# Patient Record
Sex: Female | Born: 1945 | Race: White | Hispanic: No | Smoking: Never smoker
Health system: Southern US, Community
[De-identification: ages and names within clinical notes are randomized; demographics above are authoritative.]

## PROBLEM LIST (undated history)

## (undated) DIAGNOSIS — I10 Essential (primary) hypertension: Secondary | ICD-10-CM

## (undated) DIAGNOSIS — I4510 Unspecified right bundle-branch block: Secondary | ICD-10-CM

## (undated) DIAGNOSIS — I350 Nonrheumatic aortic (valve) stenosis: Secondary | ICD-10-CM

## (undated) DIAGNOSIS — K219 Gastro-esophageal reflux disease without esophagitis: Secondary | ICD-10-CM

## (undated) DIAGNOSIS — M810 Age-related osteoporosis without current pathological fracture: Secondary | ICD-10-CM

## (undated) DIAGNOSIS — Z952 Presence of prosthetic heart valve: Secondary | ICD-10-CM

## (undated) DIAGNOSIS — Z9889 Other specified postprocedural states: Secondary | ICD-10-CM

## (undated) HISTORY — PX: TRANSTHORACIC ECHOCARDIOGRAM: SHX275

## (undated) HISTORY — DX: Other specified postprocedural states: Z98.890

## (undated) HISTORY — DX: Age-related osteoporosis without current pathological fracture: M81.0

---
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
## 2017-02-24 ENCOUNTER — Other Ambulatory Visit: Payer: Self-pay | Admitting: Family

## 2017-02-24 DIAGNOSIS — Z1231 Encounter for screening mammogram for malignant neoplasm of breast: Secondary | ICD-10-CM

## 2017-03-25 ENCOUNTER — Ambulatory Visit
Admission: RE | Admit: 2017-03-25 | Discharge: 2017-03-25 | Disposition: A | Discharge status: Home / Self Care | Payer: Medicare Other | Source: Ambulatory Visit | Attending: Family | Admitting: Family

## 2017-03-25 DIAGNOSIS — Z1231 Encounter for screening mammogram for malignant neoplasm of breast: Secondary | ICD-10-CM

## 2018-02-03 ENCOUNTER — Other Ambulatory Visit: Payer: Self-pay | Admitting: Family

## 2018-02-03 DIAGNOSIS — Z1231 Encounter for screening mammogram for malignant neoplasm of breast: Secondary | ICD-10-CM

## 2018-03-31 ENCOUNTER — Ambulatory Visit: Payer: Medicare Other

## 2018-04-08 ENCOUNTER — Ambulatory Visit
Admission: RE | Admit: 2018-04-08 | Discharge: 2018-04-08 | Disposition: A | Discharge status: Home / Self Care | Payer: Medicare Other | Source: Ambulatory Visit | Attending: Family | Admitting: Family

## 2018-04-08 DIAGNOSIS — Z1231 Encounter for screening mammogram for malignant neoplasm of breast: Secondary | ICD-10-CM

## 2019-02-16 HISTORY — PX: TRANSTHORACIC ECHOCARDIOGRAM: SHX275

## 2019-03-18 ENCOUNTER — Other Ambulatory Visit: Payer: Self-pay | Admitting: Family

## 2019-03-18 DIAGNOSIS — Z1231 Encounter for screening mammogram for malignant neoplasm of breast: Secondary | ICD-10-CM

## 2019-04-05 ENCOUNTER — Other Ambulatory Visit: Payer: Self-pay

## 2019-04-06 ENCOUNTER — Other Ambulatory Visit: Payer: Self-pay

## 2019-04-06 ENCOUNTER — Ambulatory Visit (INDEPENDENT_AMBULATORY_CARE_PROVIDER_SITE_OTHER): Payer: Medicare Other | Admitting: Cardiology

## 2019-04-06 ENCOUNTER — Encounter: Payer: Self-pay | Admitting: Cardiology

## 2019-04-06 VITALS — BP 132/60 | HR 79 | Temp 96.8°F | Ht <= 58 in | Wt 110.0 lb

## 2019-04-06 DIAGNOSIS — R079 Chest pain, unspecified: Secondary | ICD-10-CM | POA: Diagnosis not present

## 2019-04-06 DIAGNOSIS — I350 Nonrheumatic aortic (valve) stenosis: Secondary | ICD-10-CM | POA: Insufficient documentation

## 2019-04-06 DIAGNOSIS — R0600 Dyspnea, unspecified: Secondary | ICD-10-CM | POA: Diagnosis not present

## 2019-04-06 DIAGNOSIS — R0609 Other forms of dyspnea: Secondary | ICD-10-CM

## 2019-04-06 HISTORY — DX: Nonrheumatic aortic (valve) stenosis: I35.0

## 2019-04-06 NOTE — H&P (View-Only) (Signed)
PCP: [name], NP  Clinic Note: Chief Complaint  Patient presents with  . New Patient (Initial Visit)    Aortic Stenosis  . Headache  . Chest Pain  . Shortness of Breath  . Edema    Ankles a little.    HPI:    Crystal Rose is a 73 y.o. female who is being seen today for the evaluation of Severe Aortic Stenosis on Echo (from PCP) at the request of [name], NP.  Crystal Rose was l last seen by her PCP on February 04, 2019 for routine follow-up.  She had been followed for many years for an aortic murmur.  Last echo in 2018 that showed moderate calcified aortic stenosis with mild regurgitation and grade 2 diastolic function. --> Surveillance 2D echo ordered, shown to have severe aortic stenosis and now referred TOcardiology.  Recent Hospitalizations: NONE  Reviewed  CV studies:   The following studies were reviewed today: (if available, images/films reviewed: From Epic Chart or Care Everywhere)  2D Echo ([facility]) 01/17/2017: Normal LV size, function.  EF 65-70%.  GRII DD.  Moderate aortic stenosis, mean gradient 27 mmHg, peak gradient 48 mmHg.  Peak velocity 3.5 mmHg.  Mild to moderate aortic Regurgitation.  Mildly elevated PA pressures. . 2D Echo ([facility]) 02/16/2019: Normal LV size and function.  EF 60 to 65%.  Normal RV pressures.  GR 1 DD.  Severe aortic stenosis.  (Peak velocity 4.1 m/sec, mean gradient 40.0 mmHg, peak gradient 69 mmHg).    Interval History:   Crystal Rose is a very pleasant elderly woman who is here today in referral for echocardiogram showing progression of her aortic valve disease to severe aortic stenosis.  She tells me that over the last year or so she has noted that she has had a slow down a bit.  Previously she would walk around doing what ever she would want to do, but now she notices that going up hills or up an incline/stairs will make her short of breath and if she really pushes it she may even feel some tightness in her chest.  Initially she  had chalked this up to just the fact that she may be getting a little bit lazy. She used to enjoy doing yard work and housework as well as walking, but has not done as much over the last year or so.  She does occasionally get some orthostatic dizziness after lying down or sitting down for long period time.  Has not had any syncope or near syncope though.  Cardiovascular review of symptoms: positive for - chest pain, dyspnea on exertion and Decreased exercise tolerance.  Orthostatic dizziness negative for - edema, irregular heartbeat, orthopnea, palpitations, paroxysmal nocturnal dyspnea, rapid heart rate, shortness of breath or Syncope/near syncope, TIA/amaurosis fugax.  Claudication  The patient does not have symptoms concerning for COVID-19 infection (fever, chills, cough, or new shortness of breath).  The patient is practicing social distancing. ++ Masking.  + Groceries/shopping -> she usually lets her daughter do this..  Retired.  Mostly does things around the house, but it stayed pretty well isolated.  Does church on line for instance.   REVIEWED OF SYSTEMS   ROS: A comprehensive was performed. Review of Systems  Constitutional: Negative for diaphoresis, malaise/fatigue (Just that she has a little decreased exercise tolerance and maybe is a little bit "lazy ".) and weight loss.  HENT: Positive for congestion (And rhinorrhea). Negative for nosebleeds and sore throat.  Symptoms of seasonal allergies  Eyes:       No changes in vision, has had cataracts.  Uses contacts and glasses  Respiratory: Negative for cough, shortness of breath and wheezing (Only with allergies).        Only has wheezing and sneezing with seasonal allergies.  Gastrointestinal: Negative for abdominal pain, blood in stool, melena, nausea and vomiting.       Mild indigestion.  H 2 blocker.  Genitourinary: Negative for hematuria.  Musculoskeletal: Positive for joint pain (Mild arthralgias (episodes of knee and hip  pain with lots of walking), but no significant concerns). Negative for falls.  Skin: Negative for itching and rash.  Neurological: Positive for dizziness (Per HPI). Negative for focal weakness and weakness.  Endo/Heme/Allergies: Positive for environmental allergies (Treats with Xyzal or Allegra along with "Nettie pot ").   I have reviewed and (if needed) personally updated the patient's problem list, medications, allergies, past medical and surgical history, social and family history.   PAST MEDICAL HISTORY   Past Medical History:  Diagnosis Date  . Osteoporosis, post-menopausal   . Severe aortic stenosis by prior echocardiography 04/06/2019   2D Echo [facility]) 02/16/2019: Severe aortic stenosis.  (Peak velocity 4.1 m/sec, mean gradient 40.0 mmHg, peak gradient 69 mmHg).     PAST SURGICAL HISTORY   Past Surgical History:  Procedure Laterality Date  . TRANSTHORACIC ECHOCARDIOGRAM  02/16/2019   2D Echo [facility]) : Normal LV size and function.  EF 60 to 65%.  Normal RV pressures.  GR 1 DD.  Severe aortic stenosis.  (Peak velocity 4.1 m/sec, mean gradient 40.0 mmHg, peak gradient 69 mmHg).      MEDICATIONS/ALLERGIES   Current Meds  Medication Sig  . acetaminophen (TYLENOL 8 HOUR) 650 MG CR tablet Take 650 mg by mouth every 8 (eight) hours as needed for pain.  . Ascorbic Acid (VITAMIN C) 1000 MG tablet Take 1,000 mg by mouth daily.  . ASPIRIN 81 PO Take 81 mg by mouth. Frequency:QD   Dosage:81   MG  Instructions:  Note:Dose: 81MG   . B Complex Vitamins (VITAMIN B COMPLEX) TABS Take by mouth. Frequency:QD   Dosage:0.0     Instructions:  Note:Dose: 1 TAB  . BL BETA CAROTENE PO Take by mouth. 1 po qd  . Calcium Carbonate-Vit D-Min (CALCIUM 1200 PO) Take 1,200 mg by mouth daily.  . cholecalciferol (VITAMIN D3) 25 MCG (1000 UT) tablet Take 1,000 Units by mouth daily.  Marland Kitchen. CINNAMON PO Take 1,000 mg by mouth daily.  Marland Kitchen. CRANBERRY FRUIT PO Take by mouth. 4200 mg po qd  . ibuprofen (ADVIL)  200 MG tablet   . levocetirizine (XYZAL) 5 MG tablet Take 5 mg by mouth every evening.  . Magnesium 250 MG TABS Take 250 mg by mouth daily.  . TURMERIC PO Take 1,000 mg by mouth daily.    No Known Allergies   SOCIAL HISTORY/FAMILY HISTORY   Social History   Tobacco Use  . Smoking status: Never Smoker  . Smokeless tobacco: Never Used  Substance Use Topics  . Alcohol use: Not Currently  . Drug use: Never   Social History   Social History Narrative   She is a retired Pharmacologistpharmacy technician.  Has 1 daughter that she lives with in [place].      Does not exercise regularly, but does yard work and gardening.   She lives an active lifestyle and tends to have a relatively healthy diet.      Her  daughter is a grade school teacher, and Crystal Rose has been making masks for her daughter to wear at school.    family history includes Alcoholism in her brother; Breast cancer in her mother; Dementia in her sister; Drug abuse in her brother; Heart attack (age of onset: 62) in her mother.   OBJCTIVE -PE, EKG, labs   Wt Readings from Last 3 Encounters:  04/06/19 110 lb (49.9 kg)    Physical Exam: BP 132/60 (BP Location: Left Arm, Patient Position: Sitting, Cuff Size: Normal)   Pulse 79   Temp (!) 96.8 F (36 C)   Ht  (1.473 m)   Wt 110 lb (49.9 kg)   BMI 22.99 kg/m  Physical Exam  Constitutional: She is oriented to person, place, and time. She appears well-developed and well-nourished. No distress.  Healthy-appearing lady.  No acute distress.  HENT:  Head: Normocephalic and atraumatic.  Eyes: Pupils are equal, round, and reactive to light. Conjunctivae and EOM are normal. No scleral icterus.  Neck: Normal range of motion. Neck supple. Decreased carotid pulses present. No hepatojugular reflux and no JVD present. Carotid bruit is not present (Cannot distinguish, but most likely radiated aortic murmur).  Cardiovascular: Normal rate, regular rhythm and normal pulses.  Occasional  extrasystoles are present. PMI is not displaced. Exam reveals no gallop and no friction rub.  Murmur heard. High-pitched harsh crescendo-decrescendo mid to late systolic murmur is present with a grade of 4/6 at the upper right sternal border radiating to the neck. Pulmonary/Chest: Effort normal and breath sounds normal. No respiratory distress. She has no wheezes. She has no rales.  Abdominal: Soft. Bowel sounds are normal. She exhibits no distension. There is no abdominal tenderness. There is no rebound.  Musculoskeletal: Normal range of motion.        General: No deformity or edema.  Neurological: She is oriented to person, place, and time. No cranial nerve deficit.  Skin: Skin is warm and dry. No erythema.  Psychiatric: She has a normal mood and affect. Her behavior is normal. Judgment and thought content normal.  Vitals reviewed.   Adult ECG Report  Rate: 79 ;  Rhythm: normal sinus rhythm and LVH with repolarization changes, LAFB.;   Narrative Interpretation: Abnormal but relatively stable appearing EKG  Recent Labs: From PCPs office 02/04/2019  Na+ 136, K+ 4.3, Cl- 102, HCO3-26, BUN 24, Cr 0.6, Glu 109, Ca2+ 9.8; AST 36, ALT 28, AlkP 85,  T Bili 0.6, TP 7.0,Alb 4.4.  CBC: W 4.7, H/H 12.4/36.1, Plt 331  TC 209, TG 94, HDL 106, LDL 84   ASSESSMENT/PLAN    Problem List Items Addressed This Visit    Severe aortic stenosis by prior echocardiography - Primary (Chronic)    Current Echo from [facility] suggest progression of aortic valve stenosis to severe from previous moderate in 2018. Pretty significant job and gradient in 2 years.  She is now having more exertional dyspnea and although she did not mention to her PCP, she did mention to me that if she pushes beyond the dyspnea, she will have some chest tightness.  At this point I think it is reasonable to consider that she is having somewhat symptomatic aortic stenosis with progression of disease.  We will proceed with evaluation  for possible aortic valve replacement.  Plan: We will initiate work-up with right and left heart catheterization.  I discussed with her the reasoning for this to evaluate pressures and for potential existing coronary disease.  This would also help  in determining options for aortic valve replacement.  We will need to review with valve team to determine if a local echocardiogram is required or if they can get the imaging from [facility].  I anticipate that she will be set up to follow-up in our structural heart/valve clinic once we have complete her cardiac catheterization.  She will likely be seen by at least 2 members of the valve team and we discussed and their conference to determine appropriate COA.      Relevant Orders   EKG 12-Lead   LEFT AND RIGHT HEART CATHETERIZATION WITH CORONARY ANGIOGRAM   Basic metabolic panel   CBC   DOE (dyspnea on exertion)    This could be related to deconditioning, but it seems like is been going on for the past year and coincides with worsening aortic stenosis.  Need to consider her dyspnea has a likely manifestation of aortic stenosis as there is some component of chest pain as well.  Plan: Right left heart catheterization, and referral to valve clinic      Relevant Orders   EKG 12-Lead   LEFT AND RIGHT HEART CATHETERIZATION WITH CORONARY ANGIOGRAM   Basic metabolic panel   CBC   Chest pain with high risk for cardiac etiology    Chest tightness in the setting of exertional dyspnea is concerning for potentially ischemic chest pain that may or may not be related to CAD versus her valve.  As part of AVR work-up, will start with right and left heart catheterization.  Based on her relatively remarkable lipids, I do not suspect that she will have any significant CAD.       Relevant Orders   EKG 12-Lead   LEFT AND RIGHT HEART CATHETERIZATION WITH CORONARY ANGIOGRAM   Basic metabolic panel   CBC      COVID-19 Education: The signs and symptoms of COVID-19  were discussed with the patient and how to seek care for testing (follow up with PCP or arrange E-visit).   The importance of social distancing was discussed today.  I spent a total of 30 minutes with the patient and chart review. >  50% of the time was spent in direct patient consultation.  Additional time spent with chart review (studies, outside notes, etc): 20 Total Time: 50 min  Current medicines are reviewed at length with the patient today.  (+/- concerns) n/a Does not take any medications.   Patient Instructions / Medication Changes & Studies & Tests Ordered   Patient Instructions  Medication Instructions:  No changes If you need a refill on your cardiac medications before your next appointment, please call your pharmacy.   Lab work: Labs on nov 3 ,2020 - cbc  bmp If you have labs (blood work) drawn today and your tests are completely normal, you will receive your results only by: Marland Kitchen MyChart Message (if you have MyChart) OR . A paper copy in the mail If you have any lab test that is abnormal or we need to change your treatment, we will call you to review the results.  Testing/Procedures: Will be schedule at [address]  [facility] hospital - cath lab Nov 6,2020  Your physician has requested that you have a  Right and left cardiac catheterization. Cardiac catheterization is used to diagnose and/or treat various heart conditions. Doctors may recommend this procedure for a number of different reasons. The most common reason is to evaluate chest pain. Chest pain can be a symptom of coronary artery disease (CAD),  and cardiac catheterization can show whether plaque is narrowing or blocking your heart's arteries. This procedure is also used to evaluate the valves, as well as measure the blood flow and oxygen levels in different parts of your heart. For further information please visit [email or web address] Please follow instruction sheet, as given.    Follow-Up: At [facility], you and your health needs are our priority.  As part of our continuing mission to provide you with exceptional heart care, we have created designated Provider Care Teams.  These Care Teams include your primary Cardiologist (physician) and Advanced Practice Providers (APPs -  Physician Assistants and Nurse Practitioners) who all work together to provide you with the care you need, when you need it. . You will need a follow up appointment in 3 months.  Please call our office 2 months in advance to schedule this appointment.  You may see Bryan Lemma, MD or one of the following Advanced Practice Providers on your designated Care Team:   . Theodore Demark, PA-C . Joni Reining, DNP, ANP  Any Other Special Instructions Will Be Listed Below (If Applicable).   Studies Ordered:   Orders Placed This Encounter  Procedures  . Basic metabolic panel  . CBC  . EKG 12-Lead  . LEFT AND RIGHT HEART CATHETERIZATION WITH CORONARY Jocelyn Lamer, M.D., M.S. Interventional Cardiologist   Pager # [phone] Phone # [phone] [address] [place], [place] [address]   Thank you for choosing Heartcare at [facility]!!

## 2019-04-06 NOTE — Progress Notes (Signed)
PCP: [name], NP  Clinic Note: Chief Complaint  Patient presents with  . New Patient (Initial Visit)    Aortic Stenosis  . Headache  . Chest Pain  . Shortness of Breath  . Edema    Ankles a little.    HPI:    Crystal Rose is a 73 y.o. female who is being seen today for the evaluation of Severe Aortic Stenosis on Echo (from PCP) at the request of [name], NP.  Crystal Rose was l last seen by her PCP on February 04, 2019 for routine follow-up.  She had been followed for many years for an aortic murmur.  Last echo in 2018 that showed moderate calcified aortic stenosis with mild regurgitation and grade 2 diastolic function. --> Surveillance 2D echo ordered, shown to have severe aortic stenosis and now referred TOcardiology.  Recent Hospitalizations: NONE  Reviewed  CV studies:   The following studies were reviewed today: (if available, images/films reviewed: From Epic Chart or Care Everywhere)  2D Echo ([facility]) 01/17/2017: Normal LV size, function.  EF 65-70%.  GRII DD.  Moderate aortic stenosis, mean gradient 27 mmHg, peak gradient 48 mmHg.  Peak velocity 3.5 mmHg.  Mild to moderate aortic Regurgitation.  Mildly elevated PA pressures. . 2D Echo ([facility]) 02/16/2019: Normal LV size and function.  EF 60 to 65%.  Normal RV pressures.  GR 1 DD.  Severe aortic stenosis.  (Peak velocity 4.1 m/sec, mean gradient 40.0 mmHg, peak gradient 69 mmHg).    Interval History:   Crystal Rose is a very pleasant elderly woman who is here today in referral for echocardiogram showing progression of her aortic valve disease to severe aortic stenosis.  She tells me that over the last year or so she has noted that she has had a slow down a bit.  Previously she would walk around doing what ever she would want to do, but now she notices that going up hills or up an incline/stairs will make her short of breath and if she really pushes it she may even feel some tightness in her chest.  Initially she  had chalked this up to just the fact that she may be getting a little bit lazy. She used to enjoy doing yard work and housework as well as walking, but has not done as much over the last year or so.  She does occasionally get some orthostatic dizziness after lying down or sitting down for long period time.  Has not had any syncope or near syncope though.  Cardiovascular review of symptoms: positive for - chest pain, dyspnea on exertion and Decreased exercise tolerance.  Orthostatic dizziness negative for - edema, irregular heartbeat, orthopnea, palpitations, paroxysmal nocturnal dyspnea, rapid heart rate, shortness of breath or Syncope/near syncope, TIA/amaurosis fugax.  Claudication  The patient does not have symptoms concerning for COVID-19 infection (fever, chills, cough, or new shortness of breath).  The patient is practicing social distancing. ++ Masking.  + Groceries/shopping -> she usually lets her daughter do this..  Retired.  Mostly does things around the house, but it stayed pretty well isolated.  Does church on line for instance.   REVIEWED OF SYSTEMS   ROS: A comprehensive was performed. Review of Systems  Constitutional: Negative for diaphoresis, malaise/fatigue (Just that she has a little decreased exercise tolerance and maybe is a little bit "lazy ".) and weight loss.  HENT: Positive for congestion (And rhinorrhea). Negative for nosebleeds and sore throat.  Symptoms of seasonal allergies  Eyes:       No changes in vision, has had cataracts.  Uses contacts and glasses  Respiratory: Negative for cough, shortness of breath and wheezing (Only with allergies).        Only has wheezing and sneezing with seasonal allergies.  Gastrointestinal: Negative for abdominal pain, blood in stool, melena, nausea and vomiting.       Mild indigestion.  H 2 blocker.  Genitourinary: Negative for hematuria.  Musculoskeletal: Positive for joint pain (Mild arthralgias (episodes of knee and hip  pain with lots of walking), but no significant concerns). Negative for falls.  Skin: Negative for itching and rash.  Neurological: Positive for dizziness (Per HPI). Negative for focal weakness and weakness.  Endo/Heme/Allergies: Positive for environmental allergies (Treats with Xyzal or Allegra along with "Nettie pot ").   I have reviewed and (if needed) personally updated the patient's problem list, medications, allergies, past medical and surgical history, social and family history.   PAST MEDICAL HISTORY   Past Medical History:  Diagnosis Date  . Osteoporosis, post-menopausal   . Severe aortic stenosis by prior echocardiography 04/06/2019   2D Echo [facility]) 02/16/2019: Severe aortic stenosis.  (Peak velocity 4.1 m/sec, mean gradient 40.0 mmHg, peak gradient 69 mmHg).     PAST SURGICAL HISTORY   Past Surgical History:  Procedure Laterality Date  . TRANSTHORACIC ECHOCARDIOGRAM  02/16/2019   2D Echo [facility]) : Normal LV size and function.  EF 60 to 65%.  Normal RV pressures.  GR 1 DD.  Severe aortic stenosis.  (Peak velocity 4.1 m/sec, mean gradient 40.0 mmHg, peak gradient 69 mmHg).      MEDICATIONS/ALLERGIES   Current Meds  Medication Sig  . acetaminophen (TYLENOL 8 HOUR) 650 MG CR tablet Take 650 mg by mouth every 8 (eight) hours as needed for pain.  . Ascorbic Acid (VITAMIN C) 1000 MG tablet Take 1,000 mg by mouth daily.  . ASPIRIN 81 PO Take 81 mg by mouth. Frequency:QD   Dosage:81   MG  Instructions:  Note:Dose: 81MG   . B Complex Vitamins (VITAMIN B COMPLEX) TABS Take by mouth. Frequency:QD   Dosage:0.0     Instructions:  Note:Dose: 1 TAB  . BL BETA CAROTENE PO Take by mouth. 1 po qd  . Calcium Carbonate-Vit D-Min (CALCIUM 1200 PO) Take 1,200 mg by mouth daily.  . cholecalciferol (VITAMIN D3) 25 MCG (1000 UT) tablet Take 1,000 Units by mouth daily.  Marland Kitchen. CINNAMON PO Take 1,000 mg by mouth daily.  Marland Kitchen. CRANBERRY FRUIT PO Take by mouth. 4200 mg po qd  . ibuprofen (ADVIL)  200 MG tablet   . levocetirizine (XYZAL) 5 MG tablet Take 5 mg by mouth every evening.  . Magnesium 250 MG TABS Take 250 mg by mouth daily.  . TURMERIC PO Take 1,000 mg by mouth daily.    No Known Allergies   SOCIAL HISTORY/FAMILY HISTORY   Social History   Tobacco Use  . Smoking status: Never Smoker  . Smokeless tobacco: Never Used  Substance Use Topics  . Alcohol use: Not Currently  . Drug use: Never   Social History   Social History Narrative   She is a retired Pharmacologistpharmacy technician.  Has 1 daughter that she lives with in [place].      Does not exercise regularly, but does yard work and gardening.   She lives an active lifestyle and tends to have a relatively healthy diet.      Her  daughter is a grade school teacher, and Jacoby has been making masks for her daughter to wear at school.    family history includes Alcoholism in her brother; Breast cancer in her mother; Dementia in her sister; Drug abuse in her brother; Heart attack (age of onset: 84) in her mother.   OBJCTIVE -PE, EKG, labs   Wt Readings from Last 3 Encounters:  04/06/19 110 lb (49.9 kg)    Physical Exam: BP 132/60 (BP Location: Left Arm, Patient Position: Sitting, Cuff Size: Normal)   Pulse 79   Temp (!) 96.8 F (36 C)   Ht 4' 10" (1.473 m)   Wt 110 lb (49.9 kg)   BMI 22.99 kg/m  Physical Exam  Constitutional: She is oriented to person, place, and time. She appears well-developed and well-nourished. No distress.  Healthy-appearing lady.  No acute distress.  HENT:  Head: Normocephalic and atraumatic.  Eyes: Pupils are equal, round, and reactive to light. Conjunctivae and EOM are normal. No scleral icterus.  Neck: Normal range of motion. Neck supple. Decreased carotid pulses present. No hepatojugular reflux and no JVD present. Carotid bruit is not present (Cannot distinguish, but most likely radiated aortic murmur).  Cardiovascular: Normal rate, regular rhythm and normal pulses.  Occasional  extrasystoles are present. PMI is not displaced. Exam reveals no gallop and no friction rub.  Murmur heard. High-pitched harsh crescendo-decrescendo mid to late systolic murmur is present with a grade of 4/6 at the upper right sternal border radiating to the neck. Pulmonary/Chest: Effort normal and breath sounds normal. No respiratory distress. She has no wheezes. She has no rales.  Abdominal: Soft. Bowel sounds are normal. She exhibits no distension. There is no abdominal tenderness. There is no rebound.  Musculoskeletal: Normal range of motion.        General: No deformity or edema.  Neurological: She is oriented to person, place, and time. No cranial nerve deficit.  Skin: Skin is warm and dry. No erythema.  Psychiatric: She has a normal mood and affect. Her behavior is normal. Judgment and thought content normal.  Vitals reviewed.   Adult ECG Report  Rate: 79 ;  Rhythm: normal sinus rhythm and LVH with repolarization changes, LAFB.;   Narrative Interpretation: Abnormal but relatively stable appearing EKG  Recent Labs: From PCPs office 02/04/2019  Na+ 136, K+ 4.3, Cl- 102, HCO3-26, BUN 24, Cr 0.6, Glu 109, Ca2+ 9.8; AST 36, ALT 28, AlkP 85,  T Bili 0.6, TP 7.0,Alb 4.4.  CBC: W 4.7, H/H 12.4/36.1, Plt 331  TC 209, TG 94, HDL 106, LDL 84   ASSESSMENT/PLAN    Problem List Items Addressed This Visit    Severe aortic stenosis by prior echocardiography - Primary (Chronic)    Current Echo from [facility] suggest progression of aortic valve stenosis to severe from previous moderate in 2018. Pretty significant job and gradient in 2 years.  She is now having more exertional dyspnea and although she did not mention to her PCP, she did mention to me that if she pushes beyond the dyspnea, she will have some chest tightness.  At this point I think it is reasonable to consider that she is having somewhat symptomatic aortic stenosis with progression of disease.  We will proceed with evaluation  for possible aortic valve replacement.  Plan: We will initiate work-up with right and left heart catheterization.  I discussed with her the reasoning for this to evaluate pressures and for potential existing coronary disease.  This would also help   in determining options for aortic valve replacement.  We will need to review with valve team to determine if a local echocardiogram is required or if they can get the imaging from [facility].  I anticipate that she will be set up to follow-up in our structural heart/valve clinic once we have complete her cardiac catheterization.  She will likely be seen by at least 2 members of the valve team and we discussed and their conference to determine appropriate COA.      Relevant Orders   EKG 12-Lead   LEFT AND RIGHT HEART CATHETERIZATION WITH CORONARY ANGIOGRAM   Basic metabolic panel   CBC   DOE (dyspnea on exertion)    This could be related to deconditioning, but it seems like is been going on for the past year and coincides with worsening aortic stenosis.  Need to consider her dyspnea has a likely manifestation of aortic stenosis as there is some component of chest pain as well.  Plan: Right left heart catheterization, and referral to valve clinic      Relevant Orders   EKG 12-Lead   LEFT AND RIGHT HEART CATHETERIZATION WITH CORONARY ANGIOGRAM   Basic metabolic panel   CBC   Chest pain with high risk for cardiac etiology    Chest tightness in the setting of exertional dyspnea is concerning for potentially ischemic chest pain that may or may not be related to CAD versus her valve.  As part of AVR work-up, will start with right and left heart catheterization.  Based on her relatively remarkable lipids, I do not suspect that she will have any significant CAD.       Relevant Orders   EKG 12-Lead   LEFT AND RIGHT HEART CATHETERIZATION WITH CORONARY ANGIOGRAM   Basic metabolic panel   CBC      COVID-19 Education: The signs and symptoms of COVID-19  were discussed with the patient and how to seek care for testing (follow up with PCP or arrange E-visit).   The importance of social distancing was discussed today.  I spent a total of 30 minutes with the patient and chart review. >  50% of the time was spent in direct patient consultation.  Additional time spent with chart review (studies, outside notes, etc): 20 Total Time: 50 min  Current medicines are reviewed at length with the patient today.  (+/- concerns) n/a Does not take any medications.   Patient Instructions / Medication Changes & Studies & Tests Ordered   Patient Instructions  Medication Instructions:  No changes If you need a refill on your cardiac medications before your next appointment, please call your pharmacy.   Lab work: Labs on nov 3 ,2020 - cbc  bmp If you have labs (blood work) drawn today and your tests are completely normal, you will receive your results only by: Marland Kitchen MyChart Message (if you have MyChart) OR . A paper copy in the mail If you have any lab test that is abnormal or we need to change your treatment, we will call you to review the results.  Testing/Procedures: Will be schedule at [address]  [facility] hospital - cath lab Nov 6,2020  Your physician has requested that you have a  Right and left cardiac catheterization. Cardiac catheterization is used to diagnose and/or treat various heart conditions. Doctors may recommend this procedure for a number of different reasons. The most common reason is to evaluate chest pain. Chest pain can be a symptom of coronary artery disease (CAD),  and cardiac catheterization can show whether plaque is narrowing or blocking your heart's arteries. This procedure is also used to evaluate the valves, as well as measure the blood flow and oxygen levels in different parts of your heart. For further information please visit [email or web address] Please follow instruction sheet, as given.    Follow-Up: At [facility], you and your health needs are our priority.  As part of our continuing mission to provide you with exceptional heart care, we have created designated Provider Care Teams.  These Care Teams include your primary Cardiologist (physician) and Advanced Practice Providers (APPs -  Physician Assistants and Nurse Practitioners) who all work together to provide you with the care you need, when you need it. . You will need a follow up appointment in 3 months.  Please call our office 2 months in advance to schedule this appointment.  You may see Bryan Lemma, MD or one of the following Advanced Practice Providers on your designated Care Team:   . Theodore Demark, PA-C . Joni Reining, DNP, ANP  Any Other Special Instructions Will Be Listed Below (If Applicable).   Studies Ordered:   Orders Placed This Encounter  Procedures  . Basic metabolic panel  . CBC  . EKG 12-Lead  . LEFT AND RIGHT HEART CATHETERIZATION WITH CORONARY Jocelyn Lamer, M.D., M.S. Interventional Cardiologist   Pager # [phone] Phone # [phone] [address] [place], [place] [address]   Thank you for choosing Heartcare at [facility]!!

## 2019-04-06 NOTE — Patient Instructions (Addendum)
Medication Instructions:  No changes If you need a refill on your cardiac medications before your next appointment, please call your pharmacy.   Lab work: Labs on nov 3 ,2020 - cbc  bmp If you have labs (blood work) drawn today and your tests are completely normal, you will receive your results only by: Marland Kitchen MyChart Message (if you have MyChart) OR . A paper copy in the mail If you have any lab test that is abnormal or we need to change your treatment, we will call you to review the results.  Testing/Procedures: Will be schedule at [address]  [facility] hospital - cath lab Nov 6,2020  Your physician has requested that you have a  Right and left cardiac catheterization. Cardiac catheterization is used to diagnose and/or treat various heart conditions. Doctors may recommend this procedure for a number of different reasons. The most common reason is to evaluate chest pain. Chest pain can be a symptom of coronary artery disease (CAD), and cardiac catheterization can show whether plaque is narrowing or blocking your heart's arteries. This procedure is also used to evaluate the valves, as well as measure the blood flow and oxygen levels in different parts of your heart. For further information please visit [email or web address] Please follow instruction sheet, as given.    Follow-Up: At [facility], you and your health needs are our priority.  As part of our continuing mission to provide you with exceptional heart care, we have created designated Provider Care Teams.  These Care Teams include your primary Cardiologist (physician) and Advanced Practice Providers (APPs -  Physician Assistants and Nurse Practitioners) who all work together to provide you with the care you need, when you need it. . You will need a follow up appointment in 3 months.  Please call our office 2 months in advance to schedule this appointment.  You may see Bryan Lemma, MD or one of the following Advanced Practice  Providers on your designated Care Team:   . Theodore Demark, PA-C . Joni Reining, DNP, ANP  Any Other Special Instructions Will Be Listed Below (If Applicable).       MEDICAL GROUP [facility] CARDIOVASCULAR DIVISION [facility] [address] [place] [address] Dept: [phone] Loc: [phone]  Crystal Rose  04/06/2019  You are scheduled for a Cardiac Catheterization on Friday, November 6 with Dr. Bryan Lemma.  1. Please arrive at the [facility] (Main Entrance A) at [facility]: [address] [place], [place] [address] at 7:00 AM (This time is two hours before your procedure to ensure your preparation). Free valet parking service is available.   Special note: Every effort is made to have your procedure done on time. Please understand that emergencies sometimes delay scheduled procedures.  2. Diet: Do not eat solid foods after midnight.  The patient may have clear liquids until 5am upon the day of the procedure.  3. Labs:  CBC, BMP You will need to have blood drawn on Tuesday, November 3 at [facility] [address], [place]  Open: 8am - 5pm (Lunch 12:30 - 1:30)   Phone: [phone]. You do not need to be fasting.  PLEASE GO TO [address]  FOR COVID TESTING  2:05 PM AND SELF ISOLATE UNTIL FRIDAY Apr 26, 2019.  4. Medication instructions in preparation for your procedure  On the morning of your procedure, take your Aspirin 81 mg and any morning medicines NOT listed above.  You may use  sips of water.  5. Plan for one night stay--bring personal belongings. 6. Bring a current list of your medications and current insurance cards. 7. You MUST have a responsible person to drive you home. 8. Someone MUST be with you the first 24 hours after you arrive home or your discharge will be delayed. 9. Please wear clothes that are easy to get on and off and wear slip-on shoes.  Thank you for allowing [place] to care for  you!   -- [place]

## 2019-04-07 ENCOUNTER — Encounter: Payer: Self-pay | Admitting: Cardiology

## 2019-04-07 NOTE — Assessment & Plan Note (Signed)
Current Echo from [facility] suggest progression of aortic valve stenosis to severe from previous moderate in 2018. Pretty significant job and gradient in 2 years.  She is now having more exertional dyspnea and although she did not mention to her PCP, she did mention to me that if she pushes beyond the dyspnea, she will have some chest tightness.  At this point I think it is reasonable to consider that she is having somewhat symptomatic aortic stenosis with progression of disease.  We will proceed with evaluation for possible aortic valve replacement.  Plan: We will initiate work-up with right and left heart catheterization.  I discussed with her the reasoning for this to evaluate pressures and for potential existing coronary disease.  This would also help in determining options for aortic valve replacement.  We will need to review with valve team to determine if a local echocardiogram is required or if they can get the imaging from [facility].  I anticipate that she will be set up to follow-up in our structural heart/valve clinic once we have complete her cardiac catheterization.  She will likely be seen by at least 2 members of the valve team and we discussed and their conference to determine appropriate COA.

## 2019-04-07 NOTE — Assessment & Plan Note (Signed)
Chest tightness in the setting of exertional dyspnea is concerning for potentially ischemic chest pain that may or may not be related to CAD versus her valve.  As part of AVR work-up, will start with right and left heart catheterization.  Based on her relatively remarkable lipids, I do not suspect that she will have any significant CAD.

## 2019-04-07 NOTE — Assessment & Plan Note (Addendum)
This could be related to deconditioning, but it seems like is been going on for the past year and coincides with worsening aortic stenosis.  Need to consider her dyspnea has a likely manifestation of aortic stenosis as there is some component of chest pain as well.  Plan: Right left heart catheterization, and referral to valve clinic

## 2019-04-25 ENCOUNTER — Telehealth: Payer: Self-pay | Admitting: Cardiology

## 2019-04-25 ENCOUNTER — Other Ambulatory Visit: Payer: Self-pay | Admitting: *Deleted

## 2019-04-25 DIAGNOSIS — R0600 Dyspnea, unspecified: Secondary | ICD-10-CM

## 2019-04-25 DIAGNOSIS — I350 Nonrheumatic aortic (valve) stenosis: Secondary | ICD-10-CM

## 2019-04-25 DIAGNOSIS — R0609 Other forms of dyspnea: Secondary | ICD-10-CM

## 2019-04-25 NOTE — Telephone Encounter (Signed)
° ° °  Patient needs additional instructions for pre testing (procedure scheduled 11/6)

## 2019-04-25 NOTE — Progress Notes (Signed)
LEFT AND RIGHT HEART CATH  AND ECHO ORDERS PLACED.

## 2019-04-25 NOTE — Telephone Encounter (Signed)
Called patient- she had questions on where to have her pre lab work- advised of our office and no appointment needed, as well as where to go for COVID screening- advised of where to go for that as well. Patient verbalized understanding.

## 2019-04-26 ENCOUNTER — Other Ambulatory Visit (HOSPITAL_COMMUNITY)
Admission: RE | Admit: 2019-04-26 | Discharge: 2019-04-26 | Disposition: A | Discharge status: Home / Self Care | Payer: Medicare Other | Source: Ambulatory Visit | Attending: Cardiology | Admitting: Cardiology

## 2019-04-26 DIAGNOSIS — Z01812 Encounter for preprocedural laboratory examination: Secondary | ICD-10-CM | POA: Insufficient documentation

## 2019-04-26 DIAGNOSIS — Z20828 Contact with and (suspected) exposure to other viral communicable diseases: Secondary | ICD-10-CM | POA: Insufficient documentation

## 2019-04-27 LAB — CBC
Hematocrit: 36.7 % (ref 34.0–46.6)
Hemoglobin: 12.7 g/dL (ref 11.1–15.9)
MCH: 33.4 pg — ABNORMAL HIGH (ref 26.6–33.0)
MCHC: 34.6 g/dL (ref 31.5–35.7)
MCV: 97 fL (ref 79–97)
Platelets: 333 10*3/uL (ref 150–450)
RBC: 3.8 x10E6/uL (ref 3.77–5.28)
RDW: 11.6 % — ABNORMAL LOW (ref 11.7–15.4)
WBC: 5.7 10*3/uL (ref 3.4–10.8)

## 2019-04-27 LAB — BASIC METABOLIC PANEL
BUN/Creatinine Ratio: 27 (ref 12–28)
BUN: 17 mg/dL (ref 8–27)
CO2: 27 mmol/L (ref 20–29)
Calcium: 10.1 mg/dL (ref 8.7–10.3)
Chloride: 100 mmol/L (ref 96–106)
Creatinine, Ser: 0.64 mg/dL (ref 0.57–1.00)
GFR calc Af Amer: 101 mL/min/{1.73_m2} (ref >59)
GFR calc non Af Amer: 88 mL/min/{1.73_m2} (ref >59)
Glucose: 100 mg/dL — ABNORMAL HIGH (ref 65–99)
Potassium: 4.9 mmol/L (ref 3.5–5.2)
Sodium: 139 mmol/L (ref 134–144)

## 2019-04-27 LAB — NOVEL CORONAVIRUS, NAA (HOSP ORDER, SEND-OUT TO REF LAB; TAT 18-24 HRS): SARS-CoV-2, NAA: NOT DETECTED

## 2019-04-28 ENCOUNTER — Telehealth: Payer: Self-pay | Admitting: *Deleted

## 2019-04-28 NOTE — Telephone Encounter (Signed)
Pt contacted pre-catheterization scheduled at [facility] for:  Friday April 29, 2019 9 AM Verified arrival time and place: [facility]) at: 7 AM   No solid food after midnight prior to cath, clear liquids until 5 AM day of procedure. Contrast allergy: no  AM meds can be  taken pre-cath with sip of water including: ASA 81 mg   Confirmed patient has responsible adult to drive home post procedure and observe 24 hours after arriving home: yes  Currently, due to Covid-19 pandemic, only one support person will be allowed with patient. Must be the same support person for that patient's entire stay, will be screened and required to wear a mask. They will be asked to wait in the waiting room for the duration of the patient's stay.  Patients are required to wear a mask when they enter the hospital.      COVID-19 Pre-Screening Questions:  . In the past 7 to 10 days have you had a cough,  shortness of breath, headache, congestion, fever (100 or greater) body aches, chills, sore throat, or sudden loss of taste or sense of smell? no . Have you been around anyone with known Covid 19? no . Have you been around anyone who is awaiting Covid 19 test results in the past 7 to 10 days? no . Have you been around anyone who has been exposed to Covid 19, or has mentioned symptoms of Covid 19 within the past 7 to 10 days? no   I reviewed procedure/mask/visitor instructions, Covid-19 screening questions with patient, she verbalized understanding, thanked me for call.

## 2019-04-29 ENCOUNTER — Ambulatory Visit (HOSPITAL_BASED_OUTPATIENT_CLINIC_OR_DEPARTMENT_OTHER): Payer: Medicare Other

## 2019-04-29 ENCOUNTER — Other Ambulatory Visit: Payer: Self-pay

## 2019-04-29 ENCOUNTER — Other Ambulatory Visit (HOSPITAL_COMMUNITY): Payer: Medicare Other

## 2019-04-29 ENCOUNTER — Ambulatory Visit (HOSPITAL_COMMUNITY)
Admission: RE | Admit: 2019-04-29 | Discharge: 2019-04-29 | Disposition: A | Discharge status: Home / Self Care | Payer: Medicare Other | Attending: Cardiology | Admitting: Cardiology

## 2019-04-29 ENCOUNTER — Encounter (HOSPITAL_COMMUNITY)
Admission: RE | Disposition: A | Discharge status: Home / Self Care | Payer: Self-pay | Source: Home / Self Care | Attending: Cardiology

## 2019-04-29 DIAGNOSIS — I350 Nonrheumatic aortic (valve) stenosis: Secondary | ICD-10-CM | POA: Diagnosis not present

## 2019-04-29 DIAGNOSIS — R0609 Other forms of dyspnea: Secondary | ICD-10-CM | POA: Diagnosis not present

## 2019-04-29 DIAGNOSIS — Z7982 Long term (current) use of aspirin: Secondary | ICD-10-CM | POA: Diagnosis not present

## 2019-04-29 DIAGNOSIS — R079 Chest pain, unspecified: Secondary | ICD-10-CM

## 2019-04-29 DIAGNOSIS — R0600 Dyspnea, unspecified: Secondary | ICD-10-CM

## 2019-04-29 DIAGNOSIS — R0789 Other chest pain: Secondary | ICD-10-CM | POA: Diagnosis not present

## 2019-04-29 DIAGNOSIS — Z79899 Other long term (current) drug therapy: Secondary | ICD-10-CM | POA: Diagnosis not present

## 2019-04-29 HISTORY — PX: RIGHT HEART CATH: CATH118263

## 2019-04-29 HISTORY — PX: CORONARY ANGIOGRAPHY: CATH118303

## 2019-04-29 LAB — POCT I-STAT 7, (LYTES, BLD GAS, ICA,H+H)
Bicarbonate: 24.2 mmol/L (ref 20.0–28.0)
Calcium, Ion: 1.25 mmol/L (ref 1.15–1.40)
HCT: 33 % — ABNORMAL LOW (ref 36.0–46.0)
Hemoglobin: 11.2 g/dL — ABNORMAL LOW (ref 12.0–15.0)
O2 Saturation: 95 %
Potassium: 3.8 mmol/L (ref 3.5–5.1)
Sodium: 136 mmol/L (ref 135–145)
TCO2: 25 mmol/L (ref 22–32)
pCO2 arterial: 36.4 mmHg (ref 32.0–48.0)
pH, Arterial: 7.431 (ref 7.350–7.450)
pO2, Arterial: 75 mmHg — ABNORMAL LOW (ref 83.0–108.0)

## 2019-04-29 LAB — POCT I-STAT EG7
Bicarbonate: 24.5 mmol/L (ref 20.0–28.0)
Bicarbonate: 25.2 mmol/L (ref 20.0–28.0)
Calcium, Ion: 1.17 mmol/L (ref 1.15–1.40)
Calcium, Ion: 1.24 mmol/L (ref 1.15–1.40)
HCT: 32 % — ABNORMAL LOW (ref 36.0–46.0)
HCT: 34 % — ABNORMAL LOW (ref 36.0–46.0)
Hemoglobin: 10.9 g/dL — ABNORMAL LOW (ref 12.0–15.0)
Hemoglobin: 11.6 g/dL — ABNORMAL LOW (ref 12.0–15.0)
O2 Saturation: 77 %
O2 Saturation: 78 %
Potassium: 3.7 mmol/L (ref 3.5–5.1)
Potassium: 3.9 mmol/L (ref 3.5–5.1)
Sodium: 136 mmol/L (ref 135–145)
Sodium: 137 mmol/L (ref 135–145)
TCO2: 26 mmol/L (ref 22–32)
TCO2: 26 mmol/L (ref 22–32)
pCO2, Ven: 38.7 mmHg — ABNORMAL LOW (ref 44.0–60.0)
pCO2, Ven: 40.3 mmHg — ABNORMAL LOW (ref 44.0–60.0)
pH, Ven: 7.405 (ref 7.250–7.430)
pH, Ven: 7.41 (ref 7.250–7.430)
pO2, Ven: 41 mmHg (ref 32.0–45.0)
pO2, Ven: 42 mmHg (ref 32.0–45.0)

## 2019-04-29 LAB — ECHOCARDIOGRAM COMPLETE
Height: 58 in
Weight: 1760 oz

## 2019-04-29 SURGERY — RIGHT HEART CATH

## 2019-04-29 MED ORDER — SODIUM CHLORIDE 0.9% FLUSH: 3.0000 mL | INTRAVENOUS | Status: DC | PRN

## 2019-04-29 MED ORDER — FENTANYL CITRATE (PF) 100 MCG/2ML IJ SOLN
INTRAMUSCULAR | Status: DC | PRN
  Administered 2019-04-29: 25 ug via INTRAVENOUS

## 2019-04-29 MED ORDER — FENTANYL CITRATE (PF) 100 MCG/2ML IJ SOLN
INTRAMUSCULAR | Status: AC
  Filled 2019-04-29: qty 2

## 2019-04-29 MED ORDER — HEPARIN (PORCINE) IN NACL 1000-0.9 UT/500ML-% IV SOLN
INTRAVENOUS | Status: DC | PRN
  Administered 2019-04-29 (×2): 500 mL

## 2019-04-29 MED ORDER — LIDOCAINE HCL (PF) 1 % IJ SOLN
INTRAMUSCULAR | Status: DC | PRN
  Administered 2019-04-29 (×2): 2 mL

## 2019-04-29 MED ORDER — HEPARIN (PORCINE) IN NACL 1000-0.9 UT/500ML-% IV SOLN
INTRAVENOUS | Status: AC
  Filled 2019-04-29: qty 1000

## 2019-04-29 MED ORDER — MIDAZOLAM HCL 2 MG/2ML IJ SOLN
INTRAMUSCULAR | Status: AC
  Filled 2019-04-29: qty 2

## 2019-04-29 MED ORDER — VERAPAMIL HCL 2.5 MG/ML IV SOLN
INTRAVENOUS | Status: AC
  Filled 2019-04-29: qty 2

## 2019-04-29 MED ORDER — SODIUM CHLORIDE 0.9 % IV SOLN
INTRAVENOUS | Status: DC
  Administered 2019-04-29: 07:00:00 via INTRAVENOUS

## 2019-04-29 MED ORDER — HEPARIN SODIUM (PORCINE) 1000 UNIT/ML IJ SOLN
INTRAMUSCULAR | Status: DC | PRN
  Administered 2019-04-29: 3000 [IU] via INTRAVENOUS

## 2019-04-29 MED ORDER — IOHEXOL 350 MG/ML SOLN
INTRAVENOUS | Status: DC | PRN
  Administered 2019-04-29: 60 mL

## 2019-04-29 MED ORDER — LIDOCAINE HCL (PF) 1 % IJ SOLN
INTRAMUSCULAR | Status: AC
  Filled 2019-04-29: qty 30

## 2019-04-29 MED ORDER — MIDAZOLAM HCL 2 MG/2ML IJ SOLN
INTRAMUSCULAR | Status: DC | PRN
  Administered 2019-04-29: 1 mg via INTRAVENOUS

## 2019-04-29 MED ORDER — HEPARIN SODIUM (PORCINE) 1000 UNIT/ML IJ SOLN
INTRAMUSCULAR | Status: AC
  Filled 2019-04-29: qty 1

## 2019-04-29 MED ORDER — VERAPAMIL HCL 2.5 MG/ML IV SOLN
INTRAVENOUS | Status: DC | PRN
  Administered 2019-04-29: 10 mL via INTRA_ARTERIAL

## 2019-04-29 SURGICAL SUPPLY — 13 items
CATH BALLN WEDGE 5F 110CM (CATHETERS) ×3 IMPLANT
CATH OPTITORQUE TIG 4.0 5F (CATHETERS) ×3 IMPLANT
DEVICE RAD COMP TR BAND LRG (VASCULAR PRODUCTS) ×3 IMPLANT
GLIDESHEATH SLEND SS 6F .021 (SHEATH) ×3 IMPLANT
GUIDEWIRE INQWIRE 1.5J.035X260 (WIRE) ×1 IMPLANT
INQWIRE 1.5J .035X260CM (WIRE) ×3
KIT HEART LEFT (KITS) ×3 IMPLANT
PACK CARDIAC CATHETERIZATION (CUSTOM PROCEDURE TRAY) ×3 IMPLANT
SHEATH GLIDE SLENDER 4/5FR (SHEATH) ×3 IMPLANT
SHEATH PROBE COVER 6X72 (BAG) ×3 IMPLANT
TRANSDUCER W/STOPCOCK (MISCELLANEOUS) ×3 IMPLANT
TUBING CIL FLEX 10 FLL-RA (TUBING) ×3 IMPLANT
WIRE HI TORQ VERSACORE-J 145CM (WIRE) ×3 IMPLANT

## 2019-04-29 NOTE — Progress Notes (Signed)
  Echocardiogram 2D Echocardiogram has been performed.  Darlina Sicilian M 04/29/2019, 8:24 AM

## 2019-04-29 NOTE — Interval H&P Note (Signed)
History and Physical Interval Note:  04/29/2019 9:45 AM  Crystal Rose  has presented today for surgery, with the diagnosis of Aortic stenosis.  The various methods of treatment have been discussed with the patient and family. After consideration of risks, benefits and other options for treatment, the patient has consented to  Procedure(s): RIGHT/LEFT HEART CATH AND CORONARY ANGIOGRAPHY (N/A) as a surgical intervention.  The patient's history has been reviewed, patient examined, no change in status, stable for surgery.  I have reviewed the patient's chart and labs.  Questions were answered to the patient's satisfaction.     [name]

## 2019-04-29 NOTE — Discharge Instructions (Signed)
Radial Site Care ° °This sheet gives you information about how to care for yourself after your procedure. Your health care provider may also give you more specific instructions. If you have problems or questions, contact your health care provider. °What can I expect after the procedure? °After the procedure, it is common to have: °· Bruising and tenderness at the catheter insertion area. °Follow these instructions at home: °Medicines °· Take over-the-counter and prescription medicines only as told by your health care provider. °Insertion site care °· Follow instructions from your health care provider about how to take care of your insertion site. Make sure you: °? Wash your hands with soap and water before you change your bandage (dressing). If soap and water are not available, use hand sanitizer. °? Change your dressing as told by your health care provider. °? Leave stitches (sutures), skin glue, or adhesive strips in place. These skin closures may need to stay in place for 2 weeks or longer. If adhesive strip edges start to loosen and curl up, you may trim the loose edges. Do not remove adhesive strips completely unless your health care provider tells you to do that. °· Check your insertion site every day for signs of infection. Check for: °? Redness, swelling, or pain. °? Fluid or blood. °? Pus or a bad smell. °? Warmth. °· Do not take baths, swim, or use a hot tub until your health care provider approves. °· You may shower 24-48 hours after the procedure, or as directed by your health care provider. °? Remove the dressing and gently wash the site with plain soap and water. °? Pat the area dry with a clean towel. °? Do not rub the site. That could cause bleeding. °· Do not apply powder or lotion to the site. °Activity ° °· For 24 hours after the procedure, or as directed by your health care provider: °? Do not flex or bend the affected arm. °? Do not push or pull heavy objects with the affected arm. °? Do not  drive yourself home from the hospital or clinic. You may drive 24 hours after the procedure unless your health care provider tells you not to. °? Do not operate machinery or power tools. °· Do not lift anything that is heavier than 10 lb (4.5 kg), or the limit that you are told, until your health care provider says that it is safe. °· Ask your health care provider when it is okay to: °? Return to work or school. °? Resume usual physical activities or sports. °? Resume sexual activity. °General instructions °· If the catheter site starts to bleed, raise your arm and put firm pressure on the site. If the bleeding does not stop, get help right away. This is a medical emergency. °· If you went home on the same day as your procedure, a responsible adult should be with you for the first 24 hours after you arrive home. °· Keep all follow-up visits as told by your health care provider. This is important. °Contact a health care provider if: °· You have a fever. °· You have redness, swelling, or yellow drainage around your insertion site. °Get help right away if: °· You have unusual pain at the radial site. °· The catheter insertion area swells very fast. °· The insertion area is bleeding, and the bleeding does not stop when you hold steady pressure on the area. °· Your arm or hand becomes pale, cool, tingly, or numb. °These symptoms may represent a serious problem   that is an emergency. Do not wait to see if the symptoms will go away. Get medical help right away. Call your local emergency services (911 in the [place] Do not drive yourself to the hospital. °Summary °· After the procedure, it is common to have bruising and tenderness at the site. °· Follow instructions from your health care provider about how to take care of your radial site wound. Check the wound every day for signs of infection. °· Do not lift anything that is heavier than 10 lb (4.5 kg), or the limit that you are told, until your health care provider says  that it is safe. °This information is not intended to replace advice given to you by your health care provider. Make sure you discuss any questions you have with your health care provider. °Document Released: 07/12/2010 Document Revised: 07/15/2017 Document Reviewed: 07/15/2017 °Elsevier Patient Education © 2020 [employer] ° °

## 2019-04-29 NOTE — Progress Notes (Signed)
Discharge instructions reviewed with patient and family. Verbalized understanding. 

## 2019-05-02 ENCOUNTER — Other Ambulatory Visit: Payer: Self-pay

## 2019-05-02 ENCOUNTER — Encounter (HOSPITAL_COMMUNITY): Payer: Self-pay | Admitting: Cardiology

## 2019-05-02 DIAGNOSIS — I350 Nonrheumatic aortic (valve) stenosis: Secondary | ICD-10-CM

## 2019-05-04 ENCOUNTER — Ambulatory Visit: Payer: Medicare Other

## 2019-05-05 ENCOUNTER — Other Ambulatory Visit: Payer: Self-pay

## 2019-05-05 ENCOUNTER — Encounter: Payer: Self-pay | Admitting: Cardiovascular Disease

## 2019-05-05 ENCOUNTER — Ambulatory Visit (INDEPENDENT_AMBULATORY_CARE_PROVIDER_SITE_OTHER): Payer: Medicare Other | Admitting: Cardiovascular Disease

## 2019-05-05 VITALS — BP 146/58 | HR 70 | Ht <= 58 in | Wt 112.0 lb

## 2019-05-05 DIAGNOSIS — I350 Nonrheumatic aortic (valve) stenosis: Secondary | ICD-10-CM

## 2019-05-05 MED ORDER — METOPROLOL TARTRATE 50 MG PO TABS: 50.0000 mg | ORAL_TABLET | Freq: Once | ORAL | 0 refills | Status: DC

## 2019-05-05 NOTE — Patient Instructions (Signed)
Medication Instructions:  No changes today *If you need a refill on your cardiac medications before your next appointment, please call your pharmacy*  Lab Work: None today If you have labs (blood work) drawn today and your tests are completely normal, you will receive your results only by: Marland Kitchen MyChart Message (if you have MyChart) OR . A paper copy in the mail If you have any lab test that is abnormal or we need to change your treatment, we will call you to review the results.  Testing/Procedures: See letter provided today for instructions.

## 2019-05-05 NOTE — Progress Notes (Signed)
[facility] Consult Note  Chief Complaint  Patient presents with  . New Patient (Initial Visit)    Severe aortic stenosis    History of Present Illness: 73 yo female with history of aortic stenosis who is here today as a new consult, referred by Dr. Herbie Baltimore, for evaluation of severe aortic stenosis. She has been followed for moderate aortic stenosis in primary care for several years. Most recent echo in the [facility] system in August 2020 with normal LV size and function, LVEF=60-65%. Grade 1 diastolic dysfunction. There is evidence of aortic stenosis with mean gradient of 40 mmHg, peak gradient 69 mmHg,  She was seen by Dr. Herbie Baltimore 04/06/19 and described fatigue, dyspnea with exertion, chest pressure with maximal exertion and dizziness. Echo in our system 04/29/19 with LVEF=60-65%. The aortic valve leaflets are thickened and calcified with mean gradient 41 mmHg, peak gradient 70.6 mmHg, dimensionless index 0.23, AVA 0.38 cm2. There is mild AI and mild MR. Cardiac cath 04/29/19 with no evidence of CAD.   She tells me today that she continues to have fatigue and dyspnea with exertion. She is very active around her house and yard but has slowed down lately. She has mild chest pressure with peak exertion. Mild lower extremity edema but no more than noticing a line when she takes off her socks. Weight is stable at home. She goes to the dentist regularly. She lives in her own home. Her daughter lives with her. Retired Architect.   Primary Care Physician: Marcell Anger, NP Primary Cardiologist: Bryan Lemma Referring Cardiologist: Bryan Lemma  Past Medical History:  Diagnosis Date  . H/O foot surgery   . Osteoporosis, post-menopausal   . Severe aortic stenosis by prior echocardiography 04/06/2019   2D Echo [facility]) 02/16/2019: Severe aortic stenosis.  (Peak velocity 4.1 m/sec, mean gradient 40.0 mmHg, peak gradient 69 mmHg).     Past Surgical History:   Procedure Laterality Date  . CORONARY ANGIOGRAPHY N/A 04/29/2019   Procedure: CORONARY ANGIOGRAPHY;  Surgeon: Marykay Lex, MD;  Location: [facility] INVASIVE CV LAB;  Service: Cardiovascular;  Laterality: N/A;  . RIGHT HEART CATH N/A 04/29/2019   Procedure: RIGHT HEART CATH;  Surgeon: Marykay Lex, MD;  Location: [facility] INVASIVE CV LAB;  Service: Cardiovascular;  Laterality: N/A;  . TRANSTHORACIC ECHOCARDIOGRAM  02/16/2019   2D Echo [facility]) : Normal LV size and function.  EF 60 to 65%.  Normal RV pressures.  GR 1 DD.  Severe aortic stenosis.  (Peak velocity 4.1 m/sec, mean gradient 40.0 mmHg, peak gradient 69 mmHg).     Current Outpatient Medications  Medication Sig Dispense Refill  . acetaminophen (TYLENOL 8 HOUR) 650 MG CR tablet Take 1,300 mg by mouth every 8 (eight) hours as needed for pain.     . Ascorbic Acid (VITAMIN C) 1000 MG tablet Take 1,000 mg by mouth daily.    Marland Kitchen aspirin (ASPIRIN 81) 81 MG EC tablet Take 81 mg by mouth daily.     . B Complex Vitamins (VITAMIN B COMPLEX) TABS Take 1 tablet by mouth daily.     . Calcium Carbonate-Vit D-Min (CALCIUM 1200 PO) Take 1 tablet by mouth 2 (two) times daily.     . cholecalciferol (VITAMIN D3) 25 MCG (1000 UT) tablet Take 1,000 Units by mouth daily.    . cimetidine (TAGAMET) 200 MG tablet Take 200 mg by mouth daily.    Marland Kitchen CINNAMON PO Take 1,000 mg by mouth 2 (two) times  daily.     Marland Kitchen. CRANBERRY FRUIT PO Take 1 capsule by mouth daily.     Marland Kitchen. ibuprofen (ADVIL) 200 MG tablet Take 200 mg by mouth daily as needed for moderate pain.     . Lactobacillus-Inulin (CULTURELLE DIGESTIVE HEALTH PO) Take 1 capsule by mouth daily.    Marland Kitchen. levocetirizine (XYZAL) 5 MG tablet Take 5 mg by mouth every evening.     . Magnesium 250 MG TABS Take 250 mg by mouth daily.    . multivitamin-lutein (OCUVITE-LUTEIN) CAPS capsule Take 1 capsule by mouth daily.    . TURMERIC PO Take 1,000 mg by mouth daily.    . metoprolol tartrate (LOPRESSOR) 50 MG tablet Take 1 tablet (50  mg total) by mouth once for 1 dose. 1 tablet 0   No current facility-administered medications for this visit.     No Known Allergies  Social History   Socioeconomic History  . Marital status: Widowed    Spouse name: Not on file  . Number of children: 1  . Years of education: Not on file  . Highest education level: Associate degree: occupational, Scientist, product/process developmenttechnical, or vocational program  Occupational History  . Occupation: Engineer, productionetired-pharmacy tech/hairdresser  Social Needs  . Financial resource strain: Not on file  . Food insecurity    Worry: Not on file    Inability: Not on file  . Transportation needs    Medical: Not on file    Non-medical: Not on file  Tobacco Use  . Smoking status: Never Smoker  . Smokeless tobacco: Never Used  Substance and Sexual Activity  . Alcohol use: Not Currently  . Drug use: Never  . Sexual activity: Not on file  Lifestyle  . Physical activity    Days per week: Not on file    Minutes per session: Not on file  . Stress: Not on file  Relationships  . Social Musicianconnections    Talks on phone: Not on file    Gets together: Not on file    Attends religious service: Not on file    Active member of club or organization: Not on file    Attends meetings of clubs or organizations: Not on file    Relationship status: Not on file  . Intimate partner violence    Fear of current or ex partner: Not on file    Emotionally abused: Not on file    Physically abused: Not on file    Forced sexual activity: Not on file  Other Topics Concern  . Not on file  Social History Narrative   She is a retired Pharmacologistpharmacy technician.  Has 1 daughter that she lives with in [place].      Does not exercise regularly, but does yard work and gardening.   She lives an active lifestyle and tends to have a relatively healthy diet.      Her daughter is a grade school teacher, and Bonita QuinLinda has been making masks for her daughter to wear at school.    Family History  Problem Relation Age  of Onset  . Breast cancer Mother   . Heart attack Mother [id number]  . Dementia Sister        Sadly, otherwise healthy  . Alcoholism Brother        Has lost social contact with family  . Drug abuse Brother     Review of Systems:  As stated in the HPI and otherwise negative.   BP (!) 146/58   Pulse 70  Ht 4\' 10"  (1.473 m)   Wt 112 lb (50.8 kg)   BMI 23.41 kg/m   Physical Examination: General: Well developed, well nourished, NAD  HEENT: OP clear, mucus membranes moist  SKIN: warm, dry. No rashes. Neuro: No focal deficits  Musculoskeletal: Muscle strength 5/5 all ext  Psychiatric: Mood and affect normal  Neck: No JVD, no carotid bruits, no thyromegaly, no lymphadenopathy.  Lungs:Clear bilaterally, no wheezes, rhonci, crackles Cardiovascular: Regular rate and rhythm. Loud, harsh, late peaking systolic murmur.  Abdomen:Soft. Bowel sounds present. Non-tender.  Extremities: No lower extremity edema. Pulses are 2 + in the bilateral DP/PT.  EKG:  EKG is not ordered today. The ekg ordered today demonstrates   Echo 04/29/19:  1. The aortic valve is severely calcified. Aortic valve regurgitation is mild. Severe aortic valve stenosis. Vmax 4.2 m/s, MG 41, AVA 0.35, DI 0.23  2. Left ventricular ejection fraction, by visual estimation, is 60 to 65%. The left ventricle has normal function. There is no left ventricular hypertrophy.  3. Left ventricular diastolic parameters are indeterminate.  4. Global right ventricle has normal systolic function.The right ventricular size is normal. No increase in right ventricular wall thickness.  5. Left atrial size was normal.  6. Right atrial size was normal.  7. Mild mitral annular calcification.  8. The mitral valve is abnormal. Mild mitral valve regurgitation.  9. The tricuspid valve is normal in structure. Tricuspid valve regurgitation is mild. 10. The pulmonic valve was not well visualized. Pulmonic valve regurgitation is not visualized. 11. The  tricuspid regurgitant velocity is 2.47 m/s, and with an assumed right atrial pressure of 3 mmHg, the estimated right ventricular systolic pressure is normal at 27.4 mmHg. 12. The inferior vena cava is normal in size with greater than 50% respiratory variability, suggesting right atrial pressure of 3 mmHg.  FINDINGS  Left Ventricle: Left ventricular ejection fraction, by visual estimation, is 60 to 65%. The left ventricle has normal function. The left ventricular internal cavity size was the left ventricle is normal in size. There is no left ventricular hypertrophy.  Left ventricular diastolic parameters are indeterminate.  Right Ventricle: The right ventricular size is normal. No increase in right ventricular wall thickness. Global RV systolic function is has normal systolic function. The tricuspid regurgitant velocity is 2.47 m/s, and with an assumed right atrial pressure  of 3 mmHg, the estimated right ventricular systolic pressure is normal at 27.4 mmHg.  Left Atrium: Left atrial size was normal in size.  Right Atrium: Right atrial size was normal in size  Pericardium: Trivial pericardial effusion is present.  Mitral Valve: The mitral valve is abnormal. Mild mitral annular calcification. Mild mitral valve regurgitation.  Tricuspid Valve: The tricuspid valve is normal in structure. Tricuspid valve regurgitation is mild.  Aortic Valve: The aortic valve is abnormal. Aortic valve regurgitation is mild. Severe aortic stenosis is present. Severe calcifcation. Aortic valve mean gradient measures 41.0 mmHg. Aortic valve peak gradient measures 70.6 mmHg. Aortic valve area, by  VTI measures 0.38 cm.  Pulmonic Valve: The pulmonic valve was not well visualized. Pulmonic valve regurgitation is not visualized.  Aorta: The aortic root is normal in size and structure.  Venous: The inferior vena cava is normal in size with greater than 50% respiratory variability, suggesting right atrial  pressure of 3 mmHg.  IAS/Shunts: The atrial septum is grossly normal.     LEFT VENTRICLE PLAX 2D LVIDd:         3.30 cm  Diastology LVIDs:  2.10 cm  LV e' lateral:   5.11 cm/s LV PW:         0.90 cm  LV E/e' lateral: 23.1 LV IVS:        0.90 cm  LV e' medial:    5.22 cm/s LVOT diam:     1.45 cm  LV E/e' medial:  22.6 LV SV:         30 ml LV SV Index:   20.68 LVOT Area:     1.65 cm    RIGHT VENTRICLE RV S prime:     16.80 cm/s TAPSE (M-mode): 1.8 cm  LEFT ATRIUM             Index       RIGHT ATRIUM           Index LA diam:        2.90 cm 2.05 cm/m  RA Area:     14.70 cm LA Vol (A2C):   39.7 ml 28.11 ml/m RA Volume:   35.90 ml  25.42 ml/m LA Vol (A4C):   39.4 ml 27.89 ml/m LA Biplane Vol: 40.3 ml 28.53 ml/m  AORTIC VALVE AV Area (Vmax):    0.42 cm AV Area (Vmean):   0.39 cm AV Area (VTI):     0.38 cm AV Vmax:           420.00 cm/s AV Vmean:          302.000 cm/s AV VTI:            0.953 m AV Peak Grad:      70.6 mmHg AV Mean Grad:      41.0 mmHg LVOT Vmax:         106.00 cm/s LVOT Vmean:        72.000 cm/s LVOT VTI:          0.217 m LVOT/AV VTI ratio: 0.23   AORTA Ao Root diam: 2.60 cm  MITRAL VALVE                         TRICUSPID VALVE MV Area (PHT): 2.26 cm              TR Peak grad:   24.4 mmHg MV PHT:        97.15 msec            TR Vmax:        247.00 cm/s MV Decel Time: 335 msec MV E velocity: 118.00 cm/s 103 cm/s  SHUNTS MV A velocity: 141.00 cm/s 70.3 cm/s Systemic VTI:  0.22 m MV E/A ratio:  0.84        1.5       Systemic Diam: 1.45 cm  Cardiac cath 04/29/19: SUMMARY  Angiographically Normal Coronary Arteries - Co-dominant  Norma RHC Pressures & CO/CI  Severe AS by Echo  Recent Labs: 04/26/2019: BUN 17; Creatinine, Ser 0.64; Platelets 333 04/29/2019: Hemoglobin 10.9; Potassium 3.7; Sodium 137    Wt Readings from Last 3 Encounters:  05/05/19 112 lb (50.8 kg)  04/29/19 110 lb (49.9 kg)  04/06/19 110 lb (49.9 kg)      Other studies Reviewed: Additional studies/ records that were reviewed today include: Echo and cath images reviewed by me. Office notes reviewed.  Review of the above records demonstrates: severe AS  Assessment and Plan:   1. Severe Aortic Valve Stenosis: She has severe, stage D aortic valve stenosis. I have personally reviewed the echo images. The aortic  valve is thickened, calcified with limited leaflet mobility. I think she would benefit from AVR. Given advanced age, she is not a good candidate for conventional AVR by surgical approach. I think she may be a good candidate for TAVR.   STS Risk Score: Risk of Mortality: 2.294% Renal Failure: 0.797% Permanent Stroke: 1.610% Prolonged Ventilation: 5.743% DSW Infection: 0.046% Reoperation: 3.959% Morbidity or Mortality: 10.040% Short Length of Stay: 45.320% Long Length of Stay: 3.834%  I have reviewed the natural history of aortic stenosis with the patient and their family members  who are present today. We have discussed the limitations of medical therapy and the poor prognosis associated with symptomatic aortic stenosis. We have reviewed potential treatment options, including palliative medical therapy, conventional surgical aortic valve replacement, and transcatheter aortic valve replacement. We discussed treatment options in the context of the patient's specific comorbid medical conditions.   She would like to proceed with planning for TAVR. Risks and benefits of the valve procedure are reviewed with the patient. We will arrange a cardiac CT, CTA of the chest/abdomen and pelvis, carotid artery dopplers, PT assessment and will then be refer her to see one of the CT surgeons on our TAVR team.      Current medicines are reviewed at length with the patient today.  The patient does not have concerns regarding medicines.  The following changes have been made:  no change  Labs/ tests ordered today include:  No orders of the defined  types were placed in this encounter.    Disposition:   FU with the valve team.    Signed, Lauree Chandler, MD 05/05/2019 10:19 AM    [address], [place], [place]  [address] Phone: [phone]; Fax: [phone]

## 2019-05-11 ENCOUNTER — Institutional Professional Consult (permissible substitution): Payer: Medicare Other | Admitting: Surgery

## 2019-05-11 ENCOUNTER — Other Ambulatory Visit: Payer: Self-pay

## 2019-05-11 ENCOUNTER — Ambulatory Visit (HOSPITAL_COMMUNITY): Payer: Medicare Other

## 2019-05-11 ENCOUNTER — Encounter (HOSPITAL_COMMUNITY): Payer: Self-pay

## 2019-05-11 ENCOUNTER — Encounter: Payer: Self-pay | Admitting: Physical Therapy

## 2019-05-11 ENCOUNTER — Other Ambulatory Visit (HOSPITAL_COMMUNITY): Payer: Medicare Other

## 2019-05-11 ENCOUNTER — Ambulatory Visit: Payer: Medicare Other | Attending: Cardiovascular Disease | Admitting: Physical Therapy

## 2019-05-11 ENCOUNTER — Ambulatory Visit (HOSPITAL_COMMUNITY)
Admission: RE | Admit: 2019-05-11 | Discharge: 2019-05-11 | Disposition: A | Discharge status: Home / Self Care | Payer: Medicare Other | Source: Ambulatory Visit | Attending: Cardiovascular Disease | Admitting: Cardiovascular Disease

## 2019-05-11 ENCOUNTER — Ambulatory Visit (HOSPITAL_BASED_OUTPATIENT_CLINIC_OR_DEPARTMENT_OTHER)
Admission: RE | Admit: 2019-05-11 | Discharge: 2019-05-11 | Disposition: A | Discharge status: Home / Self Care | Payer: Medicare Other | Source: Ambulatory Visit | Attending: Cardiovascular Disease | Admitting: Cardiovascular Disease

## 2019-05-11 ENCOUNTER — Encounter: Payer: Medicare Other | Admitting: Surgery

## 2019-05-11 ENCOUNTER — Encounter: Payer: Self-pay | Admitting: Surgery

## 2019-05-11 VITALS — BP 150/70 | HR 76 | Temp 97.6°F | Resp 20 | Ht <= 58 in | Wt 112.0 lb

## 2019-05-11 DIAGNOSIS — I350 Nonrheumatic aortic (valve) stenosis: Secondary | ICD-10-CM | POA: Insufficient documentation

## 2019-05-11 DIAGNOSIS — R293 Abnormal posture: Secondary | ICD-10-CM | POA: Insufficient documentation

## 2019-05-11 MED ORDER — IOHEXOL 350 MG/ML SOLN
95.0000 mL | Freq: Once | INTRAVENOUS | Status: AC | PRN
  Administered 2019-05-11: 95 mL via INTRAVENOUS

## 2019-05-11 NOTE — Therapy (Signed)
[place], [place], [address] Phone: [phone]   Fax:  [phone]  Physical Therapy Evaluation  Patient Details  Name: Jalani Cullifer MRN: [id number] Date of Birth: 05/13/1946 Referring Provider (PT): Lauree Chandler MD   Encounter Date: 05/11/2019  PT End of Session - 05/11/19 1533    Visit Number  1    Number of Visits  1    Date for PT Re-Evaluation  05/11/19    PT Start Time  1501    PT Stop Time  1531    PT Time Calculation (min)  30 min    Activity Tolerance  Patient tolerated treatment well    Behavior During Therapy  [facility] for tasks assessed/performed       Past Medical History:  Diagnosis Date  . H/O foot surgery   . Osteoporosis, post-menopausal   . Severe aortic stenosis by prior echocardiography 04/06/2019   2D Echo ([place]) 02/16/2019: Severe aortic stenosis.  (Peak velocity 4.1 m/sec, mean gradient 40.0 mmHg, peak gradient 69 mmHg).     Past Surgical History:  Procedure Laterality Date  . CORONARY ANGIOGRAPHY N/A 04/29/2019   Procedure: CORONARY ANGIOGRAPHY;  Surgeon: [name], MD;  Location: [facility];  Service: Cardiovascular;  Laterality: N/A;  . RIGHT HEART CATH N/A 04/29/2019   Procedure: RIGHT HEART CATH;  Surgeon: [name], MD;  Location: [facility];  Service: Cardiovascular;  Laterality: N/A;  . TRANSTHORACIC ECHOCARDIOGRAM  02/16/2019   2D Echo [facility]) : Normal LV size and function.  EF 60 to 65%.  Normal RV pressures.  GR 1 DD.  Severe aortic stenosis.  (Peak velocity 4.1 m/sec, mean gradient 40.0 mmHg, peak gradient 69 mmHg).     There were no vitals filed for this visit.   Subjective Assessment - 05/11/19 1507    Subjective  pt is a 73 y.o F with CC of fatigue and dyspnea that has been going for the last 6 months. she does report intermittent chest pain/ tightness and associated dizziness/ lightheaded. She currently lives with  her daughter.    Patient Stated Goals  to get the heart better    Currently in Pain?  No/denies         [facility] PT Assessment - 05/11/19 1509      Assessment   Medical Diagnosis  Severe Aortic Stenosis    Referring Provider (PT)  Lauree Chandler MD    Hand Dominance  Right      Precautions   Precautions  None      Restrictions   Weight Bearing Restrictions  No      Balance Screen   Has the patient fallen in the past 6 months  No      [place] residence    Living Arrangements  Children    Available Help at Discharge  Family    Type of [place] to enter    Entrance Stairs-Number of Steps  4    Entrance Stairs-Rails  Can reach both    [place]  One level    [place] - single point      Prior Function   Level of Independence  Independent      ROM / Strength   AROM / PROM / Strength  AROM;Strength      AROM   Overall AROM   Within  functional limits for tasks performed      Strength   Overall Strength  Within functional limits for tasks performed    Strength Assessment Site  Hand    Right Hand Grip (lbs)  25    Left Hand Grip (lbs)  18      Ambulation/Gait   Ambulation/Gait  Yes    Gait Pattern  Within Functional Limits       [facility] Pre-Surgical Assessment - 05/11/19 0001    5 Meter Walk Test- trial 1  3 sec    5 Meter Walk Test- trial 2  3 sec.     5 Meter Walk Test- trial 3  2 sec.    5 meter walk test average  2.67 sec    4 Stage Balance Test tolerated for:   10 sec.    4 Stage Balance Test Position  4    Sit To Stand Test- trial 1  9 sec.    ADL/IADL Independent with:  Bathing;Dressing;Meal prep;Finances;Yard work    ADL/IADL Freight forwarderraility Index  Vulnerable    6 Minute Walk- Baseline  yes    BP (mmHg)  138/70    HR (bpm)  68    02 Sat (%RA)  99 %    Modified Borg Scale for Dyspnea  1- Very mild shortness of breath    Perceived Rate of Exertion (Borg)  9- very light    6  Minute Walk Post Test  yes    BP (mmHg)  181/69    HR (bpm)  82    02 Sat (%RA)  81 %    Modified Borg Scale for Dyspnea  5- Strong or hard breathing    Perceived Rate of Exertion (Borg)  13- Somewhat hard    Aerobic Endurance Distance Walked  1630    Endurance additional comments  pt is (-) 5.50% limited compared to age related norm    norm for 70-79 is 1545 ft             Objective measurements completed on examination: See above findings.                           Plan - 05/11/19 1533    Clinical Impression Statement  see assessment in note    Stability/Clinical Decision Making  Stable/Uncomplicated    Clinical Decision Making  Low    PT Frequency  One time visit    PT Next Visit Plan  Pre-TAVR evaluation    Consulted and Agree with Plan of Care  Patient      Clinical Impression Statement: Pt is a 73 yo F presenting to OP PT for evaluation prior to possible TAVR surgery due to severe aortic stenosis. Pt reports onset of fatigue, SOB and occasional chest pain/ tightness and dizziness approximately 6 months ago. Symptoms are limiting endurnace. Pt presents with good ROM and strength, good balance and is assessed as low at high fall risk 4 stage balance test, good walking speed and good aerobic endurance per 6 minute walk test. Pt ambulated 1630 feet in without requiring rest break. At end of test, patient's HR was 82 bpm and O2 was 81% on room air. Pt reported 5/10 shortness of breath on modified scale for dyspnea. Pt ambulated a total of 1630 feet in 6 minute walk. SOB, fatigue and O2 desaturation increased significantly with 6 minute walk test. Based on the Short Physical Performance Battery, patient has a frailty rating of  12/12 with </= 5/12 considered frail.    Patient demonstrated the following deficits and impairments:     Visit Diagnosis: Abnormal posture     Problem List Patient Active Problem List   Diagnosis Date Noted  . Severe aortic  stenosis by prior echocardiography 04/06/2019  . DOE (dyspnea on exertion) 04/06/2019  . Chest pain with high risk for cardiac etiology 04/06/2019   Lulu Riding PT, DPT, LAT, ATC  05/11/19  3:37 PM      [facility] [address] [place], [place], [address] Phone: [phone]   Fax:  [phone]  Name: Auset Fritzler MRN: [id number] Date of Birth: November 25, 1945

## 2019-05-11 NOTE — Progress Notes (Signed)
Patient ID: Crystal Rose, female   DOB: 03-17-1946, 73 y.o.   MRN: [id number]  [facility] CONSULTATION REPORT  Referring Provider is Crystal Man, MD Primary Cardiologist is Crystal Hew, MD PCP is Crystal Pac, NP  Chief Complaint  Patient presents with   Aortic Stenosis    Surgical eval for TAVR, review all testing    HPI:  The patient is a 73 year old woman with a history of moderate aortic stenosis that has been followed in the [facility] health system.  Her most recent echocardiogram there in August 2020 showed a mean gradient of 40 mmHg with a peak gradient of 69 mmHg.  Left ventricular ejection fraction was 60 to 65%.  She was seen by Dr. Ellyn Hack in October and reported exertional fatigue and shortness of breath as well as some chest pressure and dizziness.  She had an echocardiogram on 04/29/2019 which showed a calcified aortic valve with restricted leaflet mobility.  The mean gradient was 41 mmHg with a peak gradient of 70.6 mmHg.  There was mild aortic insufficiency.  Left ventricular ejection fraction was normal.  She underwent cardiac catheterization on the same day which showed no coronary disease.  There were normal right heart pressures.  She continues to report exertional fatigue and shortness of breath and being tired most of the time.  She has had occasional episodes of dizziness but has not had any syncope.  She does report intermittent chest pressure with exertion.  She has noted a little swelling in her ankles with an indentation from her socks.  She is a retired Education administrator.  She lives with her daughter in [place] who is a Pharmacist, hospital.    Past Medical History:  Diagnosis Date   H/O foot surgery    Osteoporosis, post-menopausal    Severe aortic stenosis by prior echocardiography 04/06/2019   2D Echo ([facility]) 02/16/2019: Severe aortic stenosis.  (Peak velocity 4.1 m/sec, mean  gradient 40.0 mmHg, peak gradient 69 mmHg).     Past Surgical History:  Procedure Laterality Date   CORONARY ANGIOGRAPHY N/A 04/29/2019   Procedure: CORONARY ANGIOGRAPHY;  Surgeon: Crystal Man, MD;  Location: [facility];  Service: Cardiovascular;  Laterality: N/A;   RIGHT HEART CATH N/A 04/29/2019   Procedure: RIGHT HEART CATH;  Surgeon: Crystal Man, MD;  Location: [facility];  Service: Cardiovascular;  Laterality: N/A;   TRANSTHORACIC ECHOCARDIOGRAM  02/16/2019   2D Echo [facility]) : Normal LV size and function.  EF 60 to 65%.  Normal RV pressures.  GR 1 DD.  Severe aortic stenosis.  (Peak velocity 4.1 m/sec, mean gradient 40.0 mmHg, peak gradient 69 mmHg).     Family History  Problem Relation Age of Onset   Breast cancer Mother    Heart attack Mother 72   Dementia Sister        Sadly, otherwise healthy   Alcoholism Brother        Has lost social contact with family   Drug abuse Brother     Social History   Socioeconomic History   Marital status: Widowed    Spouse name: Not on file   Number of children: 1   Years of education: Not on file   Highest education level: Associate degree: occupational, Hotel manager, or vocational program  Occupational History   Occupation: Retired-pharmacy Medical laboratory scientific officer strain: Not on [employer]  insecurity    Worry: Not on file    Inability: Not on file   Transportation needs    Medical: Not on file    Non-medical: Not on file  Tobacco Use   Smoking status: Never Smoker   Smokeless tobacco: Never Used  Substance and Sexual Activity   Alcohol use: Not Currently   Drug use: Never   Sexual activity: Not on file  Lifestyle   Physical activity    Days per week: Not on file    Minutes per session: Not on file   Stress: Not on file  Relationships   Social connections    Talks on phone: Not on file    Gets together: Not on file    Attends religious  service: Not on file    Active member of club or organization: Not on file    Attends meetings of clubs or organizations: Not on file    Relationship status: Not on file   Intimate partner violence    Fear of current or ex partner: Not on file    Emotionally abused: Not on file    Physically abused: Not on file    Forced sexual activity: Not on file  Other Topics Concern   Not on file  Social History Narrative   She is a retired Education administrator.  Has 1 daughter that she lives with in [place].      Does not exercise regularly, but does yard work and gardening.   She lives an active lifestyle and tends to have a relatively healthy diet.      Her daughter is a grade school teacher, and Crystal Rose has been making masks for her daughter to wear at school.    Current Outpatient Medications  Medication Sig Dispense Refill   acetaminophen (TYLENOL 8 HOUR) 650 MG CR tablet Take 1,300 mg by mouth every 8 (eight) hours as needed for pain.      Ascorbic Acid (VITAMIN C) 1000 MG tablet Take 1,000 mg by mouth daily.     aspirin (ASPIRIN 81) 81 MG EC tablet Take 81 mg by mouth daily.      B Complex Vitamins (VITAMIN B COMPLEX) TABS Take 1 tablet by mouth daily.      Calcium Carbonate-Vit D-Min (CALCIUM 1200 PO) Take 1 tablet by mouth 2 (two) times daily.      cholecalciferol (VITAMIN D3) 25 MCG (1000 UT) tablet Take 1,000 Units by mouth daily.     cimetidine (TAGAMET) 200 MG tablet Take 200 mg by mouth daily.     CINNAMON PO Take 1,000 mg by mouth 2 (two) times daily.      CRANBERRY FRUIT PO Take 1 capsule by mouth daily.      ibuprofen (ADVIL) 200 MG tablet Take 200 mg by mouth daily as needed for moderate pain.      Lactobacillus-Inulin (CULTURELLE DIGESTIVE HEALTH PO) Take 1 capsule by mouth daily.     levocetirizine (XYZAL) 5 MG tablet Take 5 mg by mouth every evening.      Magnesium 250 MG TABS Take 250 mg by mouth daily.     multivitamin-lutein (OCUVITE-LUTEIN) CAPS  capsule Take 1 capsule by mouth daily.     TURMERIC PO Take 1,000 mg by mouth daily.     metoprolol tartrate (LOPRESSOR) 50 MG tablet Take 1 tablet (50 mg total) by mouth once for 1 dose. 1 tablet 0   No current facility-administered medications for this visit.     No Known  Allergies    Review of Systems:   General:  normal appetite, + decreased energy, no weight gain, no weight loss, no fever  Cardiac:  + chest pain with exertion, no chest pain at rest, +SOB with moderate exertion, no resting SOB, no PND, no orthopnea, no palpitations, no arrhythmia, no atrial fibrillation, + LE edema, + dizzy spells, no syncope  Respiratory:  + exertional shortness of breath, no home oxygen, no productive cough, no dry cough, no bronchitis, no wheezing, no hemoptysis, no asthma, no pain with inspiration or cough, no sleep apnea, no CPAP at night  GI:   no difficulty swallowing, + reflux, no frequent heartburn, no hiatal hernia, no abdominal pain, no constipation, no diarrhea, no hematochezia, no hematemesis, no melena  GU:   no dysuria,  no frequency, no urinary tract infection, no hematuria, no kidney stones, no kidney disease  Vascular:  no pain suggestive of claudication, no pain in feet, no leg cramps, no varicose veins, no DVT, no non-healing foot ulcer  Neuro:   no stroke, no TIA's, no seizures, no headaches, no temporary blindness one eye,  no slurred speech, no peripheral neuropathy, no chronic pain, no instability of gait, no memory/cognitive dysfunction  Musculoskeletal: + arthritis, no joint swelling, no myalgias, no difficulty walking, normal mobility   Skin:   no rash, no itching, no skin infections, no pressure sores or ulcerations  Psych:   no anxiety, no depression, no nervousness, no unusual recent stress  Eyes:   no blurry vision, no floaters, no recent vision changes, + wears contacts  ENT:   no hearing loss, no loose or painful teeth, no dentures, last saw dentist  01/2019  Hematologic:  no easy bruising, no abnormal bleeding, no clotting disorder, no frequent epistaxis  Endocrine:  no diabetes, does not check CBG's at home      Physical Exam:   BP (!) 150/70    Pulse 76    Temp 97.6 F (36.4 C) (Skin)    Resp 20    Ht _0  (1.473 m)    Wt 112 lb (50.8 kg)    SpO2 97% Comment: RA   BMI 23.41 kg/m   General:  Thin, well-appearing  HEENT:  Unremarkable, NCAT, PERLA, EOMI  Neck:   no JVD, no bruits, no adenopathy   Chest:   clear to auscultation, symmetrical breath sounds, no wheezes, no rhonchi   CV:   RRR, grade lll/VI crescendo/decrescendo murmur heard best at RSB,  no diastolic murmur  Abdomen:  soft, non-tender, no masses   Extremities:  warm, well-perfused, pulses palpable , no LE edema  Rectal/GU  Deferred  Neuro:   Grossly non-focal and symmetrical throughout  Skin:   Clean and dry, no rashes, no breakdown   Diagnostic Tests:  ECHOCARDIOGRAM REPORT       Patient Name:   CHENOA LUDDY Date of Exam: 04/29/2019 Medical Rec #:  [id number]   Height:       58.0 in Accession #:    [id number]  Weight:       110.0 lb Date of Birth:  1945/12/21   BSA:          1.41 m Patient Age:    58 years    BP:           147/64 mmHg Patient Gender: F           HR:           85 bpm. Exam Location:  Inpatient  Procedure: 2D Echo  Indications:    Aortic valve stenosis 424.1 / 135.0   History:        Patient has prior history of Echocardiogram examinations, most                 recent 02/14/2019. Dyspnea on exertion.   Sonographer:    Darlina Sicilian RDCS Referring Phys: [place]    1. The aortic valve is severely calcified. Aortic valve regurgitation is mild. Severe aortic valve stenosis. Vmax 4.2 m/s, MG 41, AVA 0.35, DI 0.23  2. Left ventricular ejection fraction, by visual estimation, is 60 to 65%. The left ventricle has normal function. There is no left ventricular hypertrophy.  3. Left ventricular diastolic  parameters are indeterminate.  4. Global right ventricle has normal systolic function.The right ventricular size is normal. No increase in right ventricular wall thickness.  5. Left atrial size was normal.  6. Right atrial size was normal.  7. Mild mitral annular calcification.  8. The mitral valve is abnormal. Mild mitral valve regurgitation.  9. The tricuspid valve is normal in structure. Tricuspid valve regurgitation is mild. 10. The pulmonic valve was not well visualized. Pulmonic valve regurgitation is not visualized. 11. The tricuspid regurgitant velocity is 2.47 m/s, and with an assumed right atrial pressure of 3 mmHg, the estimated right ventricular systolic pressure is normal at 27.4 mmHg. 12. The inferior vena cava is normal in size with greater than 50% respiratory variability, suggesting right atrial pressure of 3 mmHg.  FINDINGS  Left Ventricle: Left ventricular ejection fraction, by visual estimation, is 60 to 65%. The left ventricle has normal function. The left ventricular internal cavity size was the left ventricle is normal in size. There is no left ventricular hypertrophy.  Left ventricular diastolic parameters are indeterminate.  Right Ventricle: The right ventricular size is normal. No increase in right ventricular wall thickness. Global RV systolic function is has normal systolic function. The tricuspid regurgitant velocity is 2.47 m/s, and with an assumed right atrial pressure  of 3 mmHg, the estimated right ventricular systolic pressure is normal at 27.4 mmHg.  Left Atrium: Left atrial size was normal in size.  Right Atrium: Right atrial size was normal in size  Pericardium: Trivial pericardial effusion is present.  Mitral Valve: The mitral valve is abnormal. Mild mitral annular calcification. Mild mitral valve regurgitation.  Tricuspid Valve: The tricuspid valve is normal in structure. Tricuspid valve regurgitation is mild.  Aortic Valve: The aortic valve  is abnormal. Aortic valve regurgitation is mild. Severe aortic stenosis is present. Severe calcifcation. Aortic valve mean gradient measures 41.0 mmHg. Aortic valve peak gradient measures 70.6 mmHg. Aortic valve area, by  VTI measures 0.38 cm.  Pulmonic Valve: The pulmonic valve was not well visualized. Pulmonic valve regurgitation is not visualized.  Aorta: The aortic root is normal in size and structure.  Venous: The inferior vena cava is normal in size with greater than 50% respiratory variability, suggesting right atrial pressure of 3 mmHg.  IAS/Shunts: The atrial septum is grossly normal.     LEFT VENTRICLE PLAX 2D LVIDd:         3.30 cm  Diastology LVIDs:         2.10 cm  LV e' lateral:   5.11 cm/s LV PW:         0.90 cm  LV E/e' lateral: 23.1 LV IVS:        0.90 cm  LV e' medial:  5.22 cm/s LVOT diam:     1.45 cm  LV E/e' medial:  22.6 LV SV:         30 ml LV SV Index:   20.68 LVOT Area:     1.65 cm    RIGHT VENTRICLE RV S prime:     16.80 cm/s TAPSE (M-mode): 1.8 cm  LEFT ATRIUM             Index       RIGHT ATRIUM           Index LA diam:        2.90 cm 2.05 cm/m  RA Area:     14.70 cm LA Vol (A2C):   39.7 ml 28.11 ml/m RA Volume:   35.90 ml  25.42 ml/m LA Vol (A4C):   39.4 ml 27.89 ml/m LA Biplane Vol: 40.3 ml 28.53 ml/m  AORTIC VALVE AV Area (Vmax):    0.42 cm AV Area (Vmean):   0.39 cm AV Area (VTI):     0.38 cm AV Vmax:           420.00 cm/s AV Vmean:          302.000 cm/s AV VTI:            0.953 m AV Peak Grad:      70.6 mmHg AV Mean Grad:      41.0 mmHg LVOT Vmax:         106.00 cm/s LVOT Vmean:        72.000 cm/s LVOT VTI:          0.217 m LVOT/AV VTI ratio: 0.23   AORTA Ao Root diam: 2.60 cm  MITRAL VALVE                         TRICUSPID VALVE MV Area (PHT): 2.26 cm              TR Peak grad:   24.4 mmHg MV PHT:        97.15 msec            TR Vmax:        247.00 cm/s MV Decel Time: 335 msec MV E velocity: 118.00 cm/s  103 cm/s  SHUNTS MV A velocity: 141.00 cm/s 70.3 cm/s Systemic VTI:  0.22 m MV E/A ratio:  0.84        1.5       Systemic Diam: 1.45 cm    Oswaldo Milian MD Electronically signed by Oswaldo Milian MD Signature Date/Time: 04/29/2019/8:59:06 AM    Physicians  Panel Physicians Referring Physician Case Authorizing Physician  Crystal Man, MD (Primary)    Procedures  CORONARY ANGIOGRAPHY  RIGHT HEART CATH  Conclusion    Normal RHC Pressures  Normal Coronary Arteries   SUMMARY  Angiographically Normal Coronary Arteries - Co-dominant  Norma RHC Pressures & CO/CI  Severe AS by Echo  RECOMMENDATIONS  D/c home today after bedrest  TAVR / Structural Team to arrange Valve Clinic Appt.  Echocardiogram done this AM   Crystal Hew, MD    Recommendations  Antiplatelet/Anticoag No indication for antiplatelet therapy at this time . Will be on ASA/Plavix post TAVR  Discharge Date In the absence of any other complications or medical issues, we expect the patient to be ready for discharge from a cath perspective on 04/29/2019.  Indications  Severe aortic stenosis by prior echocardiogram [I35.0 (ICD-10-CM)]  Chest pain with high risk for cardiac etiology [  R07.9 (ICD-10-CM)]  DOE (dyspnea on exertion) [R06.00 (ICD-10-CM)]  Procedural Details  Technical Details Primary Care Provider: [name], NP Cardiologist: Crystal Hew, MD  Crystal Rose is a 73 y/o woman referred for New Echocardiogram finding of Severe Aortic Stenosis. She has noted intermittent exertional dyspnea & chest pain with worsening exercise intolerance.  With Severe AS & symptoms concerning for angina, she now presents for L&RHC as part of pre- AVR/TAVR work-up.  Time Out: Verified patient identification, verified procedure, site/side was marked, verified correct patient position, special equipment/implants available, medications/allergies/relevent history reviewed, required imaging and  test results available. Performed.  Access:  * RIGHT Radial Artery: 6 Fr sheath -- Seldinger technique using Micropuncture Kit Direct ultrasound guidance used.  Permanent image obtained and placed on chart. 10 mL radial cocktail IA; 3000 Units IV Heparin  * Right Brachial/Antecubital Vein: The existing 18-gauge IV was exchanged over a wire for a 5Fr  sheath  Right Heart Catheterization: 5 Fr Gordy Councilman catheter advanced under fluoroscopy with balloon inflated to the RA, RV, then PCWP-PA for hemodynamic measurement.  * Simultaneous FA & PA blood gases checked for SaO2% to calculate FICK CO/CI  * Catheter removed completely out of the body with balloon deflated.  Left Heart Catheterization: 5Fr TIG 4.0 Catheter was advanced or exchanged over a J-wire under direct fluoroscopic guidance into the ascending aorta. * LV Hemodynamics (LV Gram): Aortic Valve not crossed * Left & Right Coronary Artery Cineangiography: TIG 4.0 Catheter   Upon completion of Angiogaphy, the catheter was removed completely out of the body over a wire, without complication.  Brachial Sheath(s) removed in the Cath Lab with manual pressure for hemostasis.    Radial sheath removed in the Cardiac Catheterization lab with TR Band placed for hemostasis.  TR Band: 1025  Hours; 14 mL air  MEDICATIONS * SQ Lidocaine 39m * Radial Cocktail: 3 mg Verapmil in 10 mL NS * Isovue Contrast: 60 * Heparin: 3000 Units  Estimated blood loss <50 mL.   During this procedure medications were administered to achieve and maintain moderate conscious sedation while the patient's heart rate, blood pressure, and oxygen saturation were continuously monitored and I was present face-to-face 100% of this time.  Medications (Filter: Administrations occurring from 04/29/19 0920 to 04/29/19 1031) (important)  Continuous medications are totaled by the amount administered until 04/29/19 1031.  Medication Rate/Dose/Volume Action  Date Time   fentaNYL  (SUBLIMAZE) injection (mcg) 25 mcg Given 04/29/19 0948   Total dose as of 04/29/19 1031        25 mcg        midazolam (VERSED) injection (mg) 1 mg Given 04/29/19 0949   Total dose as of 04/29/19 1031        1 mg        lidocaine (PF) (XYLOCAINE) 1 % injection (mL) 2 mL Given 04/29/19 0958   Total dose as of 04/29/19 1031 2 mL Given 0959   4 mL        Heparin (Porcine) in NaCl 1000-0.9 UT/500ML-% SOLN (mL) 500 mL Given 04/29/19 0958   Total dose as of 04/29/19 1031 500 mL Given 0958   1,000 mL        Radial Cocktail/Verapamil only (mL) 10 mL Given 04/29/19 1001   Total dose as of 04/29/19 1031        10 mL        heparin injection (Units) 3,000 Units Given 04/29/19 1013   Total dose as of 04/29/19 1031  3,000 Units        iohexol (OMNIPAQUE) 350 MG/ML injection (mL) 60 mL Given 04/29/19 1022   Total dose as of 04/29/19 1031        60 mL        Sedation Time  Sedation Time Physician-1: 33 minutes 33 seconds  Contrast  Medication Name Total Dose  iohexol (OMNIPAQUE) 350 MG/ML injection 60 mL    Radiation/Fluoro  Fluoro time: 3.3 (min) DAP: 5 (Gycm2) Cumulative Air Kerma: 416.6 (mGy)  Complications  Complications documented before study signed (04/29/2019 06:30 AM)   No complications were associated with this study.  Documented by Crystal Man, MD - 04/29/2019 10:38 AM    Coronary Findings  Diagnostic Dominance: Co-dominant Left Main  Vessel was injected. Vessel is normal in caliber. Vessel is angiographically normal.  Left Anterior Descending  Vessel was injected. Vessel is normal in caliber. Vessel is angiographically normal.  Lateral First Diagonal Branch  Vessel is small in size.  First Septal Branch  Vessel is small in size.  Second Diagonal Branch  Vessel is small in size.  Second Septal Branch  Vessel is small in size.  Third Diagonal Branch  Vessel is small in size.  Left Circumflex  Vessel was injected. Vessel is normal in caliber and moderate  in size. Vessel is angiographically normal.  First Left Posterolateral Branch  Vessel is small in size.  Second Left Posterolateral Branch  Vessel is small in size.  Left Posterior Atrioventricular Artery  Vessel is moderate in size.  Right Coronary Artery  Vessel was injected. Vessel is normal in caliber. Vessel is angiographically normal.  Acute Marginal Branch  Vessel is small in size.  Right Ventricular Branch  Vessel is small in size.  Right Posterior Descending Artery  Vessel is small in size.  First Right Posterolateral Branch  Vessel is small in size.  Intervention  No interventions have been documented. Right Heart  Right Heart Pressures PAP-Mean: 27/8 mmHg, 17 mmHg PCWP 8 mmHg AoSat, 95%, PA Sat 77% Cardiac Output-Index (FICK): 6.05 - 4.28  Right Atrium Right atrial pressure is normal. RAP 1-2 mmHg  Right Ventricle RVP-EDP 35/1 mmHg, 2 mmHg  Wall Motion  Resting    Aortic Valve not crossed.         Coronary Diagrams  Diagnostic Dominance: Co-dominant  Intervention  Implants   No implant documentation for this case.  Syngo Images  Show images for CARDIAC CATHETERIZATION  Images on Long Term Storage  Show images for Jaselynn, Tamas to Procedure Log  Procedure Log    Hemo Data   Most Recent Value  Fick Cardiac Output 6.05 L/min  Fick Cardiac Output Index 4.28 (L/min)/BSA  RA A Wave 4 mmHg  RA V Wave 2 mmHg  RA Mean 1 mmHg  RV Systolic Pressure 35 mmHg  RV Diastolic Pressure -2 mmHg  RV EDP 2 mmHg  PA Systolic Pressure 27 mmHg  PA Diastolic Pressure 8 mmHg  PA Mean 17 mmHg  PW A Wave 14 mmHg  PW V Wave 7 mmHg  PW Mean 8 mmHg  AO Systolic Pressure 160 mmHg  AO Diastolic Pressure 57 mmHg  AO Mean 88 mmHg  QP/QS 1  TPVR Index 3.98 HRUI  TSVR Index 20.56 HRUI  PVR SVR Ratio 0.1  TPVR/TSVR Ratio 0.19    ADDENDUM REPORT: 05/11/2019 13:16  CLINICAL DATA:  73 year old female with severe aortic stenosis being evaluated for a TAVR  procedure.  EXAM: Cardiac TAVR  CT  TECHNIQUE: The patient was scanned on a [employer]. A 120 kV retrospective scan was triggered in the descending thoracic aorta at 111 HU's. Gantry rotation speed was 250 msecs and collimation was .6 mm. No beta blockade or nitro were given. The 3D data set was reconstructed in 5% intervals of the R-R cycle. Systolic and diastolic phases were analyzed on a dedicated work station using MPR, MIP and VRT modes. The patient received 80 cc of contrast.  FINDINGS: Aortic Valve: Trileaflet aortic valve with severely thickened, moderately calcified leaflets with moderate asymmetric calcifications extending into the LVOT under the left coronary cusp.  Aorta: Normal size with only mild diffuse atherosclerotic plaque and calcifications and no dissection.  Sinotubular Junction: 22 x 21 mm  Ascending Thoracic Aorta: 27 x 27 mm  Aortic Arch: 24 x 23 mm  Descending Thoracic Aorta: 19 x 19 mm  Sinus of Valsalva Measurements:  Non-coronary: 27 mm  Right -coronary: 25 mm  Left -coronary: 26 mm  Coronary Artery Height above Annulus:  Left Main: 12 mm  Right Coronary: 15 mm  Virtual Basal Annulus Measurements:  Maximum/Minimum Diameter: 22.3 x 18.8 mm  Mean Diameter: 20.4 mm  Perimeter: 65.3 mm  Area: 328 mm2  Optimum Fluoroscopic Angle for Delivery: LAO 13 CAU 9  IMPRESSION: 1. Trileaflet aortic valve with severely thickened, moderately calcified leaflets with moderate asymmetric calcifications extending into the LVOT under the left coronary cusp. Aortic valve calcium score is 2347 consistent with severe aortic stenosis. Annular measurements suitable for delivery of a 20 mm Edwards-SAPIEN 3 Ultra valve or a 26 mm CoreValve Evolut R valve.  2. Sufficient coronary to annulus distance.  3. Optimum Fluoroscopic Angle for Delivery: LAO 13 CAU 9.  4. No thrombus in the left atrial  appendage.   Electronically Signed   By: Ena Dawley   On: 05/11/2019 13:16   Addended by Dorothy Spark, MD on 05/11/2019 1:18 PM    Study Result  EXAM: OVER-READ INTERPRETATION  CT CHEST  The following report is an over-read performed by radiologist Dr. Salvatore Marvel of [facility], [place] on 05/11/2019. This over-read does not include interpretation of cardiac or coronary anatomy or pathology. The coronary CTA interpretation by the cardiologist is attached.  COMPARISON:  None.  FINDINGS: Please see the separate concurrent chest CT angiogram report for details.  IMPRESSION: Please see the separate concurrent chest CT angiogram report for details.  Electronically Signed: By: Ilona Sorrel M.D. On: 05/11/2019 12:38     CLINICAL DATA:  Severe symptomatic aortic stenosis. Pre-TAVR evaluation.  EXAM: CT ANGIOGRAPHY CHEST, ABDOMEN AND PELVIS  TECHNIQUE: Multidetector CT imaging through the chest, abdomen and pelvis was performed using the standard protocol during bolus administration of intravenous contrast. Multiplanar reconstructed images and MIPs were obtained and reviewed to evaluate the vascular anatomy.  CONTRAST:  54m OMNIPAQUE IOHEXOL 350 MG/ML SOLN  COMPARISON:  None.  FINDINGS: CTA CHEST FINDINGS  Cardiovascular: Borderline mild cardiomegaly. Diffuse thickening and calcification of the aortic valve. No significant pericardial effusion/thickening. Atherosclerotic nonaneurysmal thoracic aorta. Normal caliber pulmonary arteries. No central pulmonary emboli.  Mediastinum/Nodes: No discrete thyroid nodules. Unremarkable esophagus. No pathologically enlarged axillary, mediastinal or hilar lymph nodes.  Lungs/Pleura: No pneumothorax. No pleural effusion. No acute consolidative airspace disease, lung masses or significant pulmonary nodules. Mosaic attenuation throughout both lungs.  Musculoskeletal: No aggressive appearing  focal osseous lesions. Moderate T9 vertebral compression fracture, chronic appearing. Moderate thoracic spondylosis.  CTA ABDOMEN AND PELVIS FINDINGS  Hepatobiliary: Normal liver with no liver mass. Normal gallbladder with no radiopaque cholelithiasis. No biliary ductal dilatation.  Pancreas: Normal, with no mass or duct dilation.  Spleen: Normal size. No mass.  Adrenals/Urinary Tract: Normal adrenals. No hydronephrosis. Subcentimeter hypodense renal cortical lesion in the anterior lower right kidney, too small to characterize, requiring no follow-up. No additional contour deforming renal lesions. Normal bladder.  Stomach/Bowel: Normal non-distended stomach. Normal caliber small bowel with no small bowel wall thickening. Normal appendix. Normal large bowel with no diverticulosis, large bowel wall thickening or pericolonic fat stranding.  Vascular/Lymphatic: Atherosclerotic nonaneurysmal abdominal aorta. No pathologically enlarged lymph nodes in the abdomen or pelvis.  Reproductive: Grossly normal uterus.  No adnexal mass.  Other: No pneumoperitoneum, ascites or focal fluid collection.  Musculoskeletal: No aggressive appearing focal osseous lesions. Marked lumbar spondylosis.  VASCULAR MEASUREMENTS PERTINENT TO TAVR:  AORTA:  Minimal Aortic Diameter-12.0 x 10.3 mm  Severity of Aortic Calcification-mild  RIGHT PELVIS:  Right Common Iliac Artery -  Minimal Diameter-7.1 x 6.9 mm  Tortuosity-severe  Calcification-mild  Right External Iliac Artery -  Minimal Diameter-5.8 x 5.8 mm  Tortuosity-mild-to-moderate  Calcification-none  Right Common Femoral Artery -  Minimal Diameter-6.6 x 6.0 mm  Tortuosity-mild  Calcification-none  LEFT PELVIS:  Left Common Iliac Artery -  Minimal Diameter-6.0 x 6.0 mm  Tortuosity-moderate to severe  Calcification-none  Left External Iliac Artery -  Minimal Diameter-5.7 x 5.5  mm  Tortuosity-mild  Calcification-none  Left Common Femoral Artery -  Minimal Diameter-6.7 x 6.0 mm  Tortuosity-mild  Calcification-none  Review of the MIP images confirms the above findings.  IMPRESSION: 1. Vascular findings and measurements pertinent to potential TAVR procedure, as detailed. 2. Diffuse thickening and calcification of the aortic valve, compatible with the reported history of severe symptomatic aortic stenosis. 3. Borderline mild cardiomegaly. 4. Aortic Atherosclerosis (ICD10-I70.0).   Electronically Signed   By: Ilona Sorrel M.D.   On: 05/11/2019 13:20   STS Risk Score: AVR  Risk of Mortality: 2.294% Renal Failure: 0.797% Permanent Stroke: 1.610% Prolonged Ventilation: 5.743% DSW Infection: 0.046% Reoperation: 3.959% Morbidity or Mortality: 10.040% Short Length of Stay: 45.320% Long Length of Stay: 3.834%  Impression:  This 73 year old woman has stage D, severe, symptomatic aortic stenosis with New York Heart Association class III symptoms of exertional fatigue and shortness of breath as well as dizziness and chest discomfort consistent with chronic diastolic congestive heart failure.  I have personally reviewed her 2D echocardiogram, cardiac catheterization, and CTA studies.  Her 2D echocardiogram shows a severely calcified aortic valve with restricted leaflet mobility.  The mean gradient is 41 mmHg with a valve area measured at 0.38 cm consistent with severe aortic stenosis.  Left ventricular ejection fraction is normal.  Cardiac catheterization shows no significant coronary disease with normal right heart pressures.  I agree that aortic valve replacement is indicated in this active patient to improve her symptoms and prevent progressive left ventricular deterioration.  She is a low risk surgical patient but would rather have transcatheter aortic valve replacement if possible to minimize her recovery time.  Her gated cardiac CTA  shows anatomy suitable for transcatheter aortic valve replacement.  Her annular area is relatively small at 328 mm which would only allow a 20 mm SAPIEN 3 valve.  I think a 26 mm Medtronic Evolut valve would be a better option for her.  She does have some asymmetric calcification below the left coronary leaflet extending down into the left ventricular outflow tract which could increase  her risk of perivalvular leak.  Her abdominal and pelvic CTA shows adequate pelvic vascular anatomy to allow transfemoral insertion.  The patient was counseled at length regarding treatment alternatives for management of severe symptomatic aortic stenosis. The risks and benefits of surgical intervention has been discussed in detail. Long-term prognosis with medical therapy was discussed. Alternative approaches such as conventional surgical aortic valve replacement, transcatheter aortic valve replacement, and palliative medical therapy were compared and contrasted at length. This discussion was placed in the context of the patient's own specific clinical presentation and past medical history. All of her questions have been addressed.   Following the decision to proceed with transcatheter aortic valve replacement, a discussion was held regarding what types of management strategies would be attempted intraoperatively in the event of life-threatening complications, including whether or not the patient would be considered a candidate for the use of cardiopulmonary bypass and/or conversion to open sternotomy for attempted surgical intervention.  I think she is a candidate for emergent sternotomy to manage any intraoperative complications.    The patient has been advised of a variety of complications that might develop including but not limited to risks of death, stroke, paravalvular leak, aortic dissection or other major vascular complications, aortic annulus rupture, device embolization, cardiac rupture or perforation, mitral  regurgitation, acute myocardial infarction, arrhythmia, heart block or bradycardia requiring permanent pacemaker placement, congestive heart failure, respiratory failure, renal failure, pneumonia, infection, other late complications related to structural valve deterioration or migration, or other complications that might ultimately cause a temporary or permanent loss of functional independence or other long term morbidity. The patient provides full informed consent for the procedure as described and all questions were answered.     Plan:  She will be scheduled for transfemoral transcatheter aortic valve replacement using a Medtronic Evolut valve.  She would like to wait until her daughter is on winter break from teaching school before proceeding with surgery.  She is going to talk to her daughter and will schedule surgery and the first part of December.  I spent 60 minutes performing this consultation and > 50% of this time was spent face to face counseling and coordinating the care of this patient's severe symptomatic aortic stenosis.     Gaye Pollack, MD 05/11/2019

## 2019-05-11 NOTE — Progress Notes (Signed)
Carotid artery duplex completed. Refer to "CV Proc" under chart review to view preliminary results.  05/11/2019 9:24 AM Kelby Aline., MHA, RVT, RDCS, RDMS

## 2019-05-13 ENCOUNTER — Other Ambulatory Visit: Payer: Self-pay

## 2019-05-15 ENCOUNTER — Other Ambulatory Visit: Payer: Self-pay

## 2019-05-15 DIAGNOSIS — I350 Nonrheumatic aortic (valve) stenosis: Secondary | ICD-10-CM

## 2019-05-20 ENCOUNTER — Other Ambulatory Visit (HOSPITAL_COMMUNITY): Payer: Medicare Other

## 2019-06-02 NOTE — Progress Notes (Signed)
[facility] [place], [place], [place] [address] Phone: [phone] Fax: [phone]      Your procedure is scheduled on Tuesday, December 15th.  Report to [facility] Main Entrance "A" at 5:30 A.M., and check in at the Admitting office.  Call this number if you have problems the morning of surgery:  [phone]  Call [phone] if you have any questions prior to your surgery date Monday-Friday 8am-4pm    Remember:  Do not eat or drink after midnight the night before your surgery    Take these medicines the morning of surgery with A SIP OF WATER   NONE  7 days prior to surgery STOP taking any Aspirin (unless otherwise instructed by your surgeon), Aleve, Naproxen, Ibuprofen, Motrin, Advil, Goody's, BC's, all herbal medications, fish oil, and all vitamins.    The Morning of Surgery  Do not wear jewelry, make-up or nail polish.  Do not wear lotions, powders, or perfumes, or deodorant  Do not shave 48 hours prior to surgery.    Do not bring valuables to the hospital.  [facility] is not responsible for any belongings or valuables.  If you are a smoker, DO NOT Smoke 24 hours prior to surgery  If you wear a CPAP at night please bring your mask, tubing, and machine the morning of surgery   Remember that you must have someone to transport you home after your surgery, and remain with you for 24 hours if you are discharged the same day.   Please bring cases for contacts, glasses, hearing aids, dentures or bridgework because it cannot be worn into surgery.    Leave your suitcase in the car.  After surgery it may be brought to your room.  For patients admitted to the hospital, discharge time will be determined by your treatment team.  Patients discharged the day of surgery will not be allowed to drive home.    Special instructions:   [facility]- Preparing For Surgery  Before surgery, you can play an  important role. Because skin is not sterile, your skin needs to be as free of germs as possible. You can reduce the number of germs on your skin by washing with CHG (chlorahexidine gluconate) Soap before surgery.  CHG is an antiseptic cleaner which kills germs and bonds with the skin to continue killing germs even after washing.    Oral Hygiene is also important to reduce your risk of infection.  Remember - BRUSH YOUR TEETH THE MORNING OF SURGERY WITH YOUR REGULAR TOOTHPASTE  Please do not use if you have an allergy to CHG or antibacterial soaps. If your skin becomes reddened/irritated stop using the CHG.  Do not shave (including legs and underarms) for at least 48 hours prior to first CHG shower. It is OK to shave your face.  Please follow these instructions carefully.   1. Shower the NIGHT BEFORE SURGERY and the MORNING OF SURGERY with CHG Soap.   2. If you chose to wash your hair, wash your hair first as usual with your normal shampoo.  3. After you shampoo, rinse your hair and body thoroughly to remove the shampoo.  4. Use CHG as you would any other liquid soap. You can apply CHG directly to the skin and wash gently with a scrungie or a clean washcloth.   5. Apply the CHG Soap to your body ONLY FROM THE NECK DOWN.  Do not use on open wounds or open sores. Avoid contact with your eyes, ears, mouth and genitals (private parts). Wash Face and genitals (private parts)  with your normal soap.   6. Wash thoroughly, paying special attention to the area where your surgery will be performed.  7. Thoroughly rinse your body with warm water from the neck down.  8. DO NOT shower/wash with your normal soap after using and rinsing off the CHG Soap.  9. Pat yourself dry with a CLEAN TOWEL.  10. Wear CLEAN PAJAMAS to bed the night before surgery, wear comfortable clothes the morning of surgery  11. Place CLEAN SHEETS on your bed the night of your first shower and DO NOT SLEEP WITH PETS.    Day of  Surgery:  Please shower the morning of surgery with the CHG soap Do not apply any deodorants/lotions. Please wear clean clothes to the hospital/surgery center.   Remember to brush your teeth WITH YOUR REGULAR TOOTHPASTE.   Please read over the following fact sheets that you were given.

## 2019-06-03 ENCOUNTER — Encounter (HOSPITAL_COMMUNITY)
Admission: RE | Admit: 2019-06-03 | Discharge: 2019-06-03 | Disposition: A | Discharge status: Home / Self Care | Payer: Medicare Other | Source: Ambulatory Visit | Attending: Cardiovascular Disease | Admitting: Cardiovascular Disease

## 2019-06-03 ENCOUNTER — Ambulatory Visit (HOSPITAL_COMMUNITY)
Admission: RE | Admit: 2019-06-03 | Discharge: 2019-06-03 | Disposition: A | Discharge status: Home / Self Care | Payer: Medicare Other | Source: Ambulatory Visit | Attending: Cardiovascular Disease | Admitting: Cardiovascular Disease

## 2019-06-03 ENCOUNTER — Other Ambulatory Visit (HOSPITAL_COMMUNITY)
Admission: RE | Admit: 2019-06-03 | Discharge: 2019-06-03 | Disposition: A | Discharge status: Home / Self Care | Payer: Medicare Other | Source: Ambulatory Visit | Attending: Cardiovascular Disease | Admitting: Cardiovascular Disease

## 2019-06-03 ENCOUNTER — Encounter (HOSPITAL_COMMUNITY): Payer: Self-pay

## 2019-06-03 ENCOUNTER — Other Ambulatory Visit: Payer: Self-pay

## 2019-06-03 DIAGNOSIS — Z01818 Encounter for other preprocedural examination: Secondary | ICD-10-CM | POA: Insufficient documentation

## 2019-06-03 DIAGNOSIS — I350 Nonrheumatic aortic (valve) stenosis: Secondary | ICD-10-CM | POA: Diagnosis present

## 2019-06-03 DIAGNOSIS — I444 Left anterior fascicular block: Secondary | ICD-10-CM | POA: Insufficient documentation

## 2019-06-03 DIAGNOSIS — Z20828 Contact with and (suspected) exposure to other viral communicable diseases: Secondary | ICD-10-CM | POA: Diagnosis not present

## 2019-06-03 DIAGNOSIS — I4510 Unspecified right bundle-branch block: Secondary | ICD-10-CM | POA: Insufficient documentation

## 2019-06-03 DIAGNOSIS — R9431 Abnormal electrocardiogram [ECG] [EKG]: Secondary | ICD-10-CM | POA: Insufficient documentation

## 2019-06-03 HISTORY — DX: Essential (primary) hypertension: I10

## 2019-06-03 HISTORY — DX: Gastro-esophageal reflux disease without esophagitis: K21.9

## 2019-06-03 LAB — COMPREHENSIVE METABOLIC PANEL
ALT: 24 U/L (ref 0–44)
AST: 26 U/L (ref 15–41)
Albumin: 4 g/dL (ref 3.5–5.0)
Alkaline Phosphatase: 71 U/L (ref 38–126)
Anion gap: 10 (ref 5–15)
BUN: 18 mg/dL (ref 8–23)
CO2: 21 mmol/L — ABNORMAL LOW (ref 22–32)
Calcium: 9.1 mg/dL (ref 8.9–10.3)
Chloride: 107 mmol/L (ref 98–111)
Creatinine, Ser: 0.58 mg/dL (ref 0.44–1.00)
GFR calc Af Amer: >60 mL/min (ref >60)
GFR calc non Af Amer: >60 mL/min (ref >60)
Glucose, Bld: 105 mg/dL — ABNORMAL HIGH (ref 70–99)
Potassium: 4.3 mmol/L (ref 3.5–5.1)
Sodium: 138 mmol/L (ref 135–145)
Total Bilirubin: 0.6 mg/dL (ref 0.3–1.2)
Total Protein: 6.4 g/dL — ABNORMAL LOW (ref 6.5–8.1)

## 2019-06-03 LAB — CBC
HCT: 36.5 % (ref 36.0–46.0)
Hemoglobin: 12.6 g/dL (ref 12.0–15.0)
MCH: 33.5 pg (ref 26.0–34.0)
MCHC: 34.5 g/dL (ref 30.0–36.0)
MCV: 97.1 fL (ref 80.0–100.0)
Platelets: 315 10*3/uL (ref 150–400)
RBC: 3.76 MIL/uL — ABNORMAL LOW (ref 3.87–5.11)
RDW: 11.8 % (ref 11.5–15.5)
WBC: 5.4 10*3/uL (ref 4.0–10.5)
nRBC: 0 % (ref 0.0–0.2)

## 2019-06-03 LAB — ABO/RH: ABO/RH(D): A POS

## 2019-06-03 LAB — URINALYSIS, ROUTINE W REFLEX MICROSCOPIC
Bilirubin Urine: NEGATIVE
Glucose, UA: NEGATIVE mg/dL
Hgb urine dipstick: NEGATIVE
Ketones, ur: NEGATIVE mg/dL
Leukocytes,Ua: NEGATIVE
Nitrite: NEGATIVE
Protein, ur: NEGATIVE mg/dL
Specific Gravity, Urine: 1.02 (ref 1.005–1.030)
pH: 5 (ref 5.0–8.0)

## 2019-06-03 LAB — BLOOD GAS, ARTERIAL
Acid-base deficit: 0.1 mmol/L (ref 0.0–2.0)
Bicarbonate: 23.7 mmol/L (ref 20.0–28.0)
Drawn by: 421801
FIO2: 21
O2 Saturation: 98.8 %
Patient temperature: 37
pCO2 arterial: 37 mmHg (ref 32.0–48.0)
pH, Arterial: 7.423 (ref 7.350–7.450)
pO2, Arterial: 138 mmHg — ABNORMAL HIGH (ref 83.0–108.0)

## 2019-06-03 LAB — PROTIME-INR
INR: 0.9 (ref 0.8–1.2)
Prothrombin Time: 12.4 seconds (ref 11.4–15.2)

## 2019-06-03 LAB — TYPE AND SCREEN
ABO/RH(D): A POS
Antibody Screen: NEGATIVE

## 2019-06-03 LAB — HEMOGLOBIN A1C
Hgb A1c MFr Bld: 5.8 % — ABNORMAL HIGH (ref 4.8–5.6)
Mean Plasma Glucose: 119.76 mg/dL

## 2019-06-03 LAB — SURGICAL PCR SCREEN
MRSA, PCR: NEGATIVE
Staphylococcus aureus: NEGATIVE

## 2019-06-03 LAB — BRAIN NATRIURETIC PEPTIDE: B Natriuretic Peptide: 125.2 pg/mL — ABNORMAL HIGH (ref 0.0–100.0)

## 2019-06-03 LAB — APTT: aPTT: 24 seconds (ref 24–36)

## 2019-06-03 NOTE — Progress Notes (Signed)
PCP - Marrianne Mood Cardiologist - Dr. Ellyn Hack  Chest x-ray - 06-03-19 EKG - 06-03-19 ECHO - 04-29-19 Cardiac Cath - 04-29-19   COVID TEST- Friday, December 11th   Anesthesia review: yes, heart history  Patient denies shortness of breath, fever, cough and chest pain at PAT appointment   All instructions explained to the patient, with a verbal understanding of the material. Patient agrees to go over the instructions while at home for a better understanding. Patient also instructed to self quarantine after being tested for COVID-19. The opportunity to ask questions was provided.

## 2019-06-04 LAB — NOVEL CORONAVIRUS, NAA (HOSP ORDER, SEND-OUT TO REF LAB; TAT 18-24 HRS): SARS-CoV-2, NAA: NOT DETECTED

## 2019-06-06 MED ORDER — DEXMEDETOMIDINE HCL IN NACL 400 MCG/100ML IV SOLN
0.1000 ug/kg/h | INTRAVENOUS | Status: AC
  Administered 2019-06-07: 1 ug/kg/h via INTRAVENOUS
  Administered 2019-06-07: 50.4 ug via INTRAVENOUS
  Filled 2019-06-06: qty 100

## 2019-06-06 MED ORDER — SODIUM CHLORIDE 0.9 % IV SOLN
1.5000 g | INTRAVENOUS | Status: AC
  Administered 2019-06-07: 1.5 g via INTRAVENOUS
  Filled 2019-06-06: qty 1.5

## 2019-06-06 MED ORDER — POTASSIUM CHLORIDE 2 MEQ/ML IV SOLN
80.0000 meq | INTRAVENOUS | Status: DC
  Filled 2019-06-06: qty 40

## 2019-06-06 MED ORDER — VANCOMYCIN HCL 10 G IV SOLR
1250.0000 mg | INTRAVENOUS | Status: AC
  Administered 2019-06-07: 1250 mg via INTRAVENOUS
  Filled 2019-06-06: qty 1250

## 2019-06-06 MED ORDER — SODIUM CHLORIDE 0.9 % IV SOLN
INTRAVENOUS | Status: DC
  Filled 2019-06-06: qty 30

## 2019-06-06 MED ORDER — MAGNESIUM SULFATE 50 % IJ SOLN
40.0000 meq | INTRAMUSCULAR | Status: DC
  Filled 2019-06-06: qty 9.85

## 2019-06-06 MED ORDER — NOREPINEPHRINE 4 MG/250ML-% IV SOLN
0.0000 ug/min | INTRAVENOUS | Status: DC
  Filled 2019-06-06: qty 250

## 2019-06-06 NOTE — Anesthesia Preprocedure Evaluation (Addendum)
Anesthesia Evaluation  Patient identified by MRN, date of birth, ID band Patient awake    Reviewed: Allergy & Precautions, NPO status , Patient's Chart, lab work & pertinent test results, reviewed documented beta blocker date and time   History of Anesthesia Complications Negative for: history of anesthetic complications  Airway Mallampati: I  TM Distance: >3 FB Neck ROM: Full    Dental  (+) Dental Advisory Given   Pulmonary neg pulmonary ROS,  06/03/2019 SARS coV2 neg   breath sounds clear to auscultation       Cardiovascular hypertension, Pt. on home beta blockers + DOE  + Valvular Problems/Murmurs (peak grad 69 mmHg, mean grad 40 mmHg) AS  Rhythm:Regular Rate:Normal  04/2019 Cath: normal coronaries 04/2019 ECHO: EF 60-65%, with normal LVF. The aortic valve is severely calcified, mild AI. Severe AS. Vmax 4.2 m/s, MG 41, AVA 0.35    Neuro/Psych negative neurological ROS     GI/Hepatic Neg liver ROS, GERD  Medicated and Controlled,  Endo/Other  negative endocrine ROS  Renal/GU negative Renal ROS     Musculoskeletal   Abdominal   Peds  Hematology negative hematology ROS (+)   Anesthesia Other Findings   Reproductive/Obstetrics                            Anesthesia Physical Anesthesia Plan  ASA: III  Anesthesia Plan: MAC   Post-op Pain Management:    Induction:   PONV Risk Score and Plan: 2 and Ondansetron, Dexamethasone and Treatment may vary due to age or medical condition  Airway Management Planned: Natural Airway and Simple Face Mask  Additional Equipment: Arterial line  Intra-op Plan:   Post-operative Plan:   Informed Consent: I have reviewed the patients History and Physical, chart, labs and discussed the procedure including the risks, benefits and alternatives for the proposed anesthesia with the patient or authorized representative who has indicated his/her  understanding and acceptance.     Dental advisory given  Plan Discussed with: CRNA and Surgeon  Anesthesia Plan Comments:        Anesthesia Quick Evaluation

## 2019-06-06 NOTE — H&P (Signed)
[address]       [place] [address]             [phone]      Cardiothoracic Surgery Admission History and Physical   Referring Provider is [name], MD  Primary Cardiologist is [name], MD  PCP is [name], NP      Chief Complaint  Patient presents with  . Aortic Stenosis       HPI:  The patient is a 73 year old woman with a history of moderate aortic stenosis that has been followed in the [facility] health system. Her most recent echocardiogram there in August 2020 showed a mean gradient of 40 mmHg with a peak gradient of 69 mmHg. Left ventricular ejection fraction was 60 to 65%. She was seen by Dr. Ellyn Hack in October and reported exertional fatigue and shortness of breath as well as some chest pressure and dizziness. She had an echocardiogram on 04/29/2019 which showed a calcified aortic valve with restricted leaflet mobility. The mean gradient was 41 mmHg with a peak gradient of 70.6 mmHg. There was mild aortic insufficiency. Left ventricular ejection fraction was normal. She underwent cardiac catheterization on the same day which showed no coronary disease. There were normal right heart pressures. She continues to report exertional fatigue and shortness of breath and being tired most of the time. She has had occasional episodes of dizziness but has not had any syncope. She does report intermittent chest pressure with exertion. She has noted a little swelling in her ankles with an indentation from her socks.   She is a retired Education administrator. She lives with her daughter in [place] who is a Pharmacist, hospital.      Past Medical History:  Diagnosis Date  . H/O foot surgery   . Osteoporosis, post-menopausal   . Severe aortic stenosis by prior echocardiography 04/06/2019   2D Echo ([facility]) 02/16/2019: Severe aortic stenosis. (Peak velocity 4.1 m/sec, mean gradient 40.0 mmHg, peak gradient 69 mmHg).         Past Surgical History:  Procedure  Laterality Date  . CORONARY ANGIOGRAPHY N/A 04/29/2019   Procedure: CORONARY ANGIOGRAPHY; Surgeon: [name], MD; Location: [facility]; Service: Cardiovascular; Laterality: N/A;  . RIGHT HEART CATH N/A 04/29/2019   Procedure: RIGHT HEART CATH; Surgeon: [name], MD; Location: [facility]; Service: Cardiovascular; Laterality: N/A;  . TRANSTHORACIC ECHOCARDIOGRAM  02/16/2019   2D Echo [facility]) : Normal LV size and function. EF 60 to 65%. Normal RV pressures. GR 1 DD. Severe aortic stenosis. (Peak velocity 4.1 m/sec, mean gradient 40.0 mmHg, peak gradient 69 mmHg).         Family History  Problem Relation Age of Onset  . Breast cancer Mother   . Heart attack Mother 53  . Dementia Sister    Sadly, otherwise healthy  . Alcoholism Brother    Has lost social contact with family  . Drug abuse Brother    Social History        Socioeconomic History  . Marital status: Widowed    Spouse name: Not on file  . Number of children: 1  . Years of education: Not on file  . Highest education level: Associate degree: occupational, Hotel manager, or vocational program  Occupational History  . Occupation: Education administrator  Social Needs  . Financial resource strain: Not on file  . Food insecurity    Worry: Not on file  Inability: Not on file  . Transportation needs    Medical: Not on file    Non-medical: Not on file  Tobacco Use  . Smoking status: Never Smoker  . Smokeless tobacco: Never Used  Substance and Sexual Activity  . Alcohol use: Not Currently  . Drug use: Never  . Sexual activity: Not on file  Lifestyle  . Physical activity    Days per week: Not on file    Minutes per session: Not on file  . Stress: Not on file  Relationships  . Social Herbalist on phone: Not on file    Gets together: Not on file    Attends religious service: Not on file    Active member of club or organization: Not on file    Attends meetings of  clubs or organizations: Not on file    Relationship status: Not on file  . Intimate partner violence    Fear of current or ex partner: Not on file    Emotionally abused: Not on file    Physically abused: Not on file    Forced sexual activity: Not on file  Other Topics Concern  . Not on file  Social History Narrative   She is a retired Education administrator. Has 1 daughter that she lives with in [place].      Does not exercise regularly, but does yard work and gardening.   She lives an active lifestyle and tends to have a relatively healthy diet.      Her daughter is a grade school teacher, and Amariya has been making masks for her daughter to wear at school.         Current Outpatient Medications  Medication Sig Dispense Refill  . acetaminophen (TYLENOL 8 HOUR) 650 MG CR tablet Take 1,300 mg by mouth every 8 (eight) hours as needed for pain.     . Ascorbic Acid (VITAMIN C) 1000 MG tablet Take 1,000 mg by mouth daily.    Marland Kitchen aspirin (ASPIRIN 81) 81 MG EC tablet Take 81 mg by mouth daily.     . B Complex Vitamins (VITAMIN B COMPLEX) TABS Take 1 tablet by mouth daily.     . Calcium Carbonate-Vit D-Min (CALCIUM 1200 PO) Take 1 tablet by mouth 2 (two) times daily.     . cholecalciferol (VITAMIN D3) 25 MCG (1000 UT) tablet Take 1,000 Units by mouth daily.    . cimetidine (TAGAMET) 200 MG tablet Take 200 mg by mouth daily.    Marland Kitchen CINNAMON PO Take 1,000 mg by mouth 2 (two) times daily.     Marland Kitchen CRANBERRY FRUIT PO Take 1 capsule by mouth daily.     Marland Kitchen ibuprofen (ADVIL) 200 MG tablet Take 200 mg by mouth daily as needed for moderate pain.     . Lactobacillus-Inulin (CULTURELLE DIGESTIVE HEALTH PO) Take 1 capsule by mouth daily.    Marland Kitchen levocetirizine (XYZAL) 5 MG tablet Take 5 mg by mouth every evening.     . Magnesium 250 MG TABS Take 250 mg by mouth daily.    . multivitamin-lutein (OCUVITE-LUTEIN) CAPS capsule Take 1 capsule by mouth daily.    . TURMERIC PO Take 1,000 mg by mouth daily.    .  metoprolol tartrate (LOPRESSOR) 50 MG tablet Take 1 tablet (50 mg total) by mouth once for 1 dose. 1 tablet 0   No current facility-administered medications for this visit.    No Known Allergies   Review of Systems:  General: normal appetite, + decreased energy, no weight gain, no weight loss, no fever  Cardiac: + chest pain with exertion, no chest pain at rest, +SOB with moderate exertion, no resting SOB, no PND, no orthopnea, no palpitations, no arrhythmia, no atrial fibrillation, + LE edema, + dizzy spells, no syncope  Respiratory: + exertional shortness of breath, no home oxygen, no productive cough, no dry cough, no bronchitis, no wheezing, no hemoptysis, no asthma, no pain with inspiration or cough, no sleep apnea, no CPAP at night  GI: no difficulty swallowing, + reflux, no frequent heartburn, no hiatal hernia, no abdominal pain, no constipation, no diarrhea, no hematochezia, no hematemesis, no melena  GU: no dysuria, no frequency, no urinary tract infection, no hematuria, no kidney stones, no kidney disease  Vascular: no pain suggestive of claudication, no pain in feet, no leg cramps, no varicose veins, no DVT, no non-healing foot ulcer  Neuro: no stroke, no TIA's, no seizures, no headaches, no temporary blindness one eye, no slurred speech, no peripheral neuropathy, no chronic pain, no instability of gait, no memory/cognitive dysfunction  Musculoskeletal: + arthritis, no joint swelling, no myalgias, no difficulty walking, normal mobility  Skin: no rash, no itching, no skin infections, no pressure sores or ulcerations  Psych: no anxiety, no depression, no nervousness, no unusual recent stress  Eyes: no blurry vision, no floaters, no recent vision changes, + wears contacts  ENT: no hearing loss, no loose or painful teeth, no dentures, last saw dentist 01/2019  Hematologic: no easy bruising, no abnormal bleeding, no clotting disorder, no frequent epistaxis  Endocrine: no diabetes, does not  check CBG's at home    Physical Exam:   BP (!) 150/70  Pulse 76  Temp 97.6 F (36.4 C) (Skin)  Resp 20  Ht _0  (1.473 m)  Wt 112 lb (50.8 kg)  SpO2 97% Comment: RA  BMI 23.41 kg/m  General: Thin, well-appearing  HEENT: Unremarkable, NCAT, PERLA, EOMI  Neck: no JVD, no bruits, no adenopathy  Chest: clear to auscultation, symmetrical breath sounds, no wheezes, no rhonchi  CV: RRR, grade lll/VI crescendo/decrescendo murmur heard best at RSB, no diastolic murmur  Abdomen: soft, non-tender, no masses  Extremities: warm, well-perfused, pulses palpable , no LE edema  Rectal/GU Deferred  Neuro: Grossly non-focal and symmetrical throughout  Skin: Clean and dry, no rashes, no breakdown    Diagnostic Tests:   ECHOCARDIOGRAM REPORT  Patient Name: JAIYANA CANALE Date of Exam: 04/29/2019  Medical Rec #: [id number] Height: 58.0 in  Accession #: [id number] Weight: 110.0 lb  Date of Birth: 1945/09/08 BSA: 1.41 m  Patient Age: 35 years BP: 147/64 mmHg  Patient Gender: F HR: 85 bpm.  Exam Location: Inpatient  Procedure: 2D Echo  Indications: Aortic valve stenosis 424.1 / 135.0  History: Patient has prior history of Echocardiogram examinations, most  recent 02/14/2019. Dyspnea on exertion.  Sonographer: Darlina Sicilian RDCS  Referring Phys: [place]  1. The aortic valve is severely calcified. Aortic valve regurgitation is mild. Severe aortic valve stenosis. Vmax 4.2 m/s, MG 41, AVA 0.35, DI 0.23  2. Left ventricular ejection fraction, by visual estimation, is 60 to 65%. The left ventricle has normal function. There is no left ventricular hypertrophy.  3. Left ventricular diastolic parameters are indeterminate.  4. Global right ventricle has normal systolic function.The right ventricular size is normal. No increase in right ventricular wall thickness.  5. Left atrial size was normal.  6. Right atrial size was  normal.  7. Mild mitral annular calcification.  8. The  mitral valve is abnormal. Mild mitral valve regurgitation.  9. The tricuspid valve is normal in structure. Tricuspid valve regurgitation is mild.  10. The pulmonic valve was not well visualized. Pulmonic valve regurgitation is not visualized.  11. The tricuspid regurgitant velocity is 2.47 m/s, and with an assumed right atrial pressure of 3 mmHg, the estimated right ventricular systolic pressure is normal at 27.4 mmHg.  12. The inferior vena cava is normal in size with greater than 50% respiratory variability, suggesting right atrial pressure of 3 mmHg.  FINDINGS  Left Ventricle: Left ventricular ejection fraction, by visual estimation, is 60 to 65%. The left ventricle has normal function. The left ventricular internal cavity size was the left ventricle is normal in size. There is no left ventricular hypertrophy.  Left ventricular diastolic parameters are indeterminate.  Right Ventricle: The right ventricular size is normal. No increase in right ventricular wall thickness. Global RV systolic function is has normal systolic function. The tricuspid regurgitant velocity is 2.47 m/s, and with an assumed right atrial pressure  of 3 mmHg, the estimated right ventricular systolic pressure is normal at 27.4 mmHg.  Left Atrium: Left atrial size was normal in size.  Right Atrium: Right atrial size was normal in size  Pericardium: Trivial pericardial effusion is present.  Mitral Valve: The mitral valve is abnormal. Mild mitral annular calcification. Mild mitral valve regurgitation.  Tricuspid Valve: The tricuspid valve is normal in structure. Tricuspid valve regurgitation is mild.  Aortic Valve: The aortic valve is abnormal. Aortic valve regurgitation is mild. Severe aortic stenosis is present. Severe calcifcation. Aortic valve mean gradient measures 41.0 mmHg. Aortic valve peak gradient measures 70.6 mmHg. Aortic valve area, by  VTI measures 0.38 cm.  Pulmonic Valve: The pulmonic valve was not well  visualized. Pulmonic valve regurgitation is not visualized.  Aorta: The aortic root is normal in size and structure.  Venous: The inferior vena cava is normal in size with greater than 50% respiratory variability, suggesting right atrial pressure of 3 mmHg.  IAS/Shunts: The atrial septum is grossly normal.  LEFT VENTRICLE  PLAX 2D  LVIDd: 3.30 cm Diastology  LVIDs: 2.10 cm LV e' lateral: 5.11 cm/s  LV PW: 0.90 cm LV E/e' lateral: 23.1  LV IVS: 0.90 cm LV e' medial: 5.22 cm/s  LVOT diam: 1.45 cm LV E/e' medial: 22.6  LV SV: 30 ml  LV SV Index: 20.68  LVOT Area: 1.65 cm  RIGHT VENTRICLE  RV S prime: 16.80 cm/s  TAPSE (M-mode): 1.8 cm  LEFT ATRIUM Index RIGHT ATRIUM Index  LA diam: 2.90 cm 2.05 cm/m RA Area: 14.70 cm  LA Vol (A2C): 39.7 ml 28.11 ml/m RA Volume: 35.90 ml 25.42 ml/m  LA Vol (A4C): 39.4 ml 27.89 ml/m  LA Biplane Vol: 40.3 ml 28.53 ml/m  AORTIC VALVE  AV Area (Vmax): 0.42 cm  AV Area (Vmean): 0.39 cm  AV Area (VTI): 0.38 cm  AV Vmax: 420.00 cm/s  AV Vmean: 302.000 cm/s  AV VTI: 0.953 m  AV Peak Grad: 70.6 mmHg  AV Mean Grad: 41.0 mmHg  LVOT Vmax: 106.00 cm/s  LVOT Vmean: 72.000 cm/s  LVOT VTI: 0.217 m  LVOT/AV VTI ratio: 0.23  AORTA  Ao Root diam: 2.60 cm  MITRAL VALVE TRICUSPID VALVE  MV Area (PHT): 2.26 cm TR Peak grad: 24.4 mmHg  MV PHT: 97.15 msec TR Vmax: 247.00 cm/s  MV Decel Time: 335 msec  MV E velocity:  118.00 cm/s 103 cm/s SHUNTS  MV A velocity: 141.00 cm/s 70.3 cm/s Systemic VTI: 0.22 m  MV E/A ratio: 0.84 1.5 Systemic Diam: 1.45 cm  Oswaldo Milian MD  Electronically signed by Oswaldo Milian MD  Signature Date/Time: 04/29/2019/8:59:06 AM     Panel Physicians Referring Physician Case Authorizing Physician  [name], MD (Primary)    Procedures  CORONARY ANGIOGRAPHY  RIGHT HEART CATH  Conclusion  Normal RHC Pressures  Normal Coronary Arteries SUMMARY  Angiographically Normal Coronary Arteries - Co-dominant    Norma RHC Pressures & CO/CI  Severe AS by Echo RECOMMENDATIONS  D/c home today after bedrest  TAVR / Structural Team to arrange Valve Clinic Appt.  Echocardiogram done this AM [name], MD   Recommendations  Antiplatelet/Anticoag No indication for antiplatelet therapy at this time . Will be on ASA/Plavix post TAVR  Discharge Date In the absence of any other complications or medical issues, we expect the patient to be ready for discharge from a cath perspective on 04/29/2019.  Indications  Severe aortic stenosis by prior echocardiogram [I35.0 (ICD-10-CM)]  Chest pain with high risk for cardiac etiology [R07.9 (ICD-10-CM)]  DOE (dyspnea on exertion) [R06.00 (ICD-10-CM)]  Procedural Details  Technical Details Primary Care Provider: [name], NP Cardiologist: [name], MD  Josanna is a 73 y/o woman referred for New Echocardiogram finding of Severe Aortic Stenosis. She has noted intermittent exertional dyspnea & chest pain with worsening exercise intolerance.  With Severe AS & symptoms concerning for angina, she now presents for L&RHC as part of pre- AVR/TAVR work-up.  Time Out: Verified patient identification, verified procedure, site/side was marked, verified correct patient position, special equipment/implants available, medications/allergies/relevent history reviewed, required imaging and test results available. Performed.  Access:  * RIGHT Radial Artery: 6 Fr sheath -- Seldinger technique using Micropuncture Kit Direct ultrasound guidance used. Permanent image obtained and placed on chart. 10 mL radial cocktail IA; 3000 Units IV Heparin  * Right Brachial/Antecubital Vein: The existing 18-gauge IV was exchanged over a wire for a 5Fr sheath  Right Heart Catheterization: 5 Fr Gordy Councilman catheter advanced under fluoroscopy with balloon inflated to the RA, RV, then PCWP-PA for hemodynamic measurement.  * Simultaneous FA & PA blood gases checked for SaO2% to calculate FICK  CO/CI  * Catheter removed completely out of the body with balloon deflated.  Left Heart Catheterization: 5Fr TIG 4.0 Catheter was advanced or exchanged over a J-wire under direct fluoroscopic guidance into the ascending aorta. * LV Hemodynamics (LV Gram): Aortic Valve not crossed * Left & Right Coronary Artery Cineangiography: TIG 4.0 Catheter   Upon completion of Angiogaphy, the catheter was removed completely out of the body over a wire, without complication.  Brachial Sheath(s) removed in the Cath Lab with manual pressure for hemostasis.   Radial sheath removed in the Cardiac Catheterization lab with TR Band placed for hemostasis.  TR Band: 1025 Hours; 14 mL air  MEDICATIONS * SQ Lidocaine 30m * Radial Cocktail: 3 mg Verapmil in 10 mL NS * Isovue Contrast: 60 * Heparin: 3000 Units  Estimated blood loss <50 mL.   During this procedure medications were administered to achieve and maintain moderate conscious sedation while the patient's heart rate, blood pressure, and oxygen saturation were continuously monitored and I was present face-to-face 100% of this time.  Medications  (Filter: Administrations occurring from 04/29/19 0920 to 04/29/19 1031)          (important) Continuous medications are totaled by the amount administered  until 04/29/19 1031.  Medication Rate/Dose/Volume Action  Date Time   fentaNYL (SUBLIMAZE) injection (mcg) 25 mcg Given 04/29/19 0948   Total dose as of 04/29/19 1031        25 mcg        midazolam (VERSED) injection (mg) 1 mg Given 04/29/19 0949   Total dose as of 04/29/19 1031        1 mg        lidocaine (PF) (XYLOCAINE) 1 % injection (mL) 2 mL Given 04/29/19 0958   Total dose as of 04/29/19 1031 2 mL Given 0959   4 mL        Heparin (Porcine) in NaCl 1000-0.9 UT/500ML-% SOLN (mL) 500 mL Given 04/29/19 0958   Total dose as of 04/29/19 1031 500 mL Given 0958   1,000 mL        Radial Cocktail/Verapamil only (mL) 10 mL Given 04/29/19 1001   Total dose  as of 04/29/19 1031        10 mL        heparin injection (Units) 3,000 Units Given 04/29/19 1013   Total dose as of 04/29/19 1031        3,000 Units        iohexol (OMNIPAQUE) 350 MG/ML injection (mL) 60 mL Given 04/29/19 1022   Total dose as of 04/29/19 1031        60 mL        Sedation Time  Sedation Time Physician-1: 33 minutes 33 seconds  Contrast  Medication Name Total Dose  iohexol (OMNIPAQUE) 350 MG/ML injection 60 mL  Radiation/Fluoro  Fluoro time: 3.3 (min)  DAP: 5 (Gycm2)  Cumulative Air Kerma: 629.5 (mGy)  Complications  Complications documented before study signed (04/29/2019 28:41 AM)   No complications were associated with this study.  Documented by [name], MD - 04/29/2019 10:38 AM  Coronary Findings  Diagnostic  Dominance: Co-dominant  Left Main  Vessel was injected. Vessel is normal in caliber. Vessel is angiographically normal.  Left Anterior Descending  Vessel was injected. Vessel is normal in caliber. Vessel is angiographically normal.  Lateral First Diagonal Branch  Vessel is small in size.  First Septal Branch  Vessel is small in size.  Second Diagonal Branch  Vessel is small in size.  Second Septal Branch  Vessel is small in size.  Third Diagonal Branch  Vessel is small in size.  Left Circumflex  Vessel was injected. Vessel is normal in caliber and moderate in size. Vessel is angiographically normal.  First Left Posterolateral Branch  Vessel is small in size.  Second Left Posterolateral Branch  Vessel is small in size.  Left Posterior Atrioventricular Artery  Vessel is moderate in size.  Right Coronary Artery  Vessel was injected. Vessel is normal in caliber. Vessel is angiographically normal.  Acute Marginal Branch  Vessel is small in size.  Right Ventricular Branch  Vessel is small in size.  Right Posterior Descending Artery  Vessel is small in size.  First Right Posterolateral Branch  Vessel is small in size.  Intervention  No  interventions have been documented.  Right Heart  Right Heart Pressures PAP-Mean: 27/8 mmHg, 17 mmHg PCWP 8 mmHg AoSat, 95%, PA Sat 77% Cardiac Output-Index (FICK): 6.05 - 4.28  Right Atrium Right atrial pressure is normal. RAP 1-2 mmHg  Right Ventricle RVP-EDP 35/1 mmHg, 2 mmHg  Wall Motion  Resting    Aortic Valve not crossed.     Coronary Diagrams  Diagnostic  Dominance: Co-dominant   Intervention  Implants     No implant documentation for this case.  Syngo Images  Link to Procedure Log   Show images for CARDIAC CATHETERIZATION Procedure Log  Images on Long Term Storage    Show images for [place], [place]   Hemo Data   Most Recent Value  Fick Cardiac Output 6.05 L/min  Fick Cardiac Output Index 4.28 (L/min)/BSA  RA A Wave 4 mmHg  RA V Wave 2 mmHg  RA Mean 1 mmHg  RV Systolic Pressure 35 mmHg  RV Diastolic Pressure -2 mmHg  RV EDP 2 mmHg  PA Systolic Pressure 27 mmHg  PA Diastolic Pressure 8 mmHg  PA Mean 17 mmHg  PW A Wave 14 mmHg  PW V Wave 7 mmHg  PW Mean 8 mmHg  AO Systolic Pressure 295 mmHg  AO Diastolic Pressure 57 mmHg  AO Mean 88 mmHg  QP/QS 1  TPVR Index 3.98 HRUI  TSVR Index 20.56 HRUI  PVR SVR Ratio 0.1  TPVR/TSVR Ratio 0.19     ADDENDUM REPORT: 05/11/2019 13:16  CLINICAL DATA: 73 year old female with severe aortic stenosis being  evaluated for a TAVR procedure.  EXAM:  Cardiac TAVR CT  TECHNIQUE:  The patient was scanned on a [employer]. A 120 kV  retrospective scan was triggered in the descending thoracic aorta at  111 HU's. Gantry rotation speed was 250 msecs and collimation was .6  mm. No beta blockade or nitro were given. The 3D data set was  reconstructed in 5% intervals of the R-R cycle. Systolic and  diastolic phases were analyzed on a dedicated work station using  MPR, MIP and VRT modes. The patient received 80 cc of contrast.  FINDINGS:  Aortic Valve: Trileaflet aortic valve with severely thickened,  moderately calcified  leaflets with moderate asymmetric  calcifications extending into the LVOT under the left coronary cusp.  Aorta: Normal size with only mild diffuse atherosclerotic plaque and  calcifications and no dissection.  Sinotubular Junction: 22 x 21 mm  Ascending Thoracic Aorta: 27 x 27 mm  Aortic Arch: 24 x 23 mm  Descending Thoracic Aorta: 19 x 19 mm  Sinus of Valsalva Measurements:  Non-coronary: 27 mm  Right -coronary: 25 mm  Left -coronary: 26 mm  Coronary Artery Height above Annulus:  Left Main: 12 mm  Right Coronary: 15 mm  Virtual Basal Annulus Measurements:  Maximum/Minimum Diameter: 22.3 x 18.8 mm  Mean Diameter: 20.4 mm  Perimeter: 65.3 mm  Area: 328 mm2  Optimum Fluoroscopic Angle for Delivery: LAO 13 CAU 9  IMPRESSION:  1. Trileaflet aortic valve with severely thickened, moderately  calcified leaflets with moderate asymmetric calcifications extending  into the LVOT under the left coronary cusp. Aortic valve calcium  score is 2347 consistent with severe aortic stenosis. Annular  measurements suitable for delivery of a 20 mm Edwards-SAPIEN 3 Ultra  valve or a 26 mm CoreValve Evolut R valve.  2. Sufficient coronary to annulus distance.  3. Optimum Fluoroscopic Angle for Delivery: LAO 13 CAU 9.  4. No thrombus in the left atrial appendage.  Electronically Signed  By: Ena Dawley  On: 05/11/2019 13:16   Addended by Dorothy Spark, MD on 05/11/2019 1:18 PM      EXAM:  OVER-READ INTERPRETATION CT CHEST  The following report is an over-read performed by radiologist Dr.  Salvatore Marvel of [facility], [place] on 05/11/2019. This over-read  does not include interpretation of cardiac or coronary anatomy or  pathology. The coronary CTA interpretation by the cardiologist is  attached.  COMPARISON: None.  FINDINGS:  Please see the separate concurrent chest CT angiogram report for  details.  IMPRESSION:  Please see the separate concurrent chest CT angiogram report for    details.  Electronically Signed:  By: Ilona Sorrel M.D.  On: 05/11/2019 12:38  CLINICAL DATA: Severe symptomatic aortic stenosis. Pre-TAVR  evaluation.  EXAM:  CT ANGIOGRAPHY CHEST, ABDOMEN AND PELVIS  TECHNIQUE:  Multidetector CT imaging through the chest, abdomen and pelvis was  performed using the standard protocol during bolus administration of  intravenous contrast. Multiplanar reconstructed images and MIPs were  obtained and reviewed to evaluate the vascular anatomy.  CONTRAST: 65m OMNIPAQUE IOHEXOL 350 MG/ML SOLN  COMPARISON: None.  FINDINGS:  CTA CHEST FINDINGS  Cardiovascular: Borderline mild cardiomegaly. Diffuse thickening and  calcification of the aortic valve. No significant pericardial  effusion/thickening. Atherosclerotic nonaneurysmal thoracic aorta.  Normal caliber pulmonary arteries. No central pulmonary emboli.  Mediastinum/Nodes: No discrete thyroid nodules. Unremarkable  esophagus. No pathologically enlarged axillary, mediastinal or hilar  lymph nodes.  Lungs/Pleura: No pneumothorax. No pleural effusion. No acute  consolidative airspace disease, lung masses or significant pulmonary  nodules. Mosaic attenuation throughout both lungs.  Musculoskeletal: No aggressive appearing focal osseous lesions.  Moderate T9 vertebral compression fracture, chronic appearing.  Moderate thoracic spondylosis.  CTA ABDOMEN AND PELVIS FINDINGS  Hepatobiliary: Normal liver with no liver mass. Normal gallbladder  with no radiopaque cholelithiasis. No biliary ductal dilatation.  Pancreas: Normal, with no mass or duct dilation.  Spleen: Normal size. No mass.  Adrenals/Urinary Tract: Normal adrenals. No hydronephrosis.  Subcentimeter hypodense renal cortical lesion in the anterior lower  right kidney, too small to characterize, requiring no follow-up. No  additional contour deforming renal lesions. Normal bladder.  Stomach/Bowel: Normal non-distended stomach. Normal caliber small   bowel with no small bowel wall thickening. Normal appendix. Normal  large bowel with no diverticulosis, large bowel wall thickening or  pericolonic fat stranding.  Vascular/Lymphatic: Atherosclerotic nonaneurysmal abdominal aorta.  No pathologically enlarged lymph nodes in the abdomen or pelvis.  Reproductive: Grossly normal uterus. No adnexal mass.  Other: No pneumoperitoneum, ascites or focal fluid collection.  Musculoskeletal: No aggressive appearing focal osseous lesions.  Marked lumbar spondylosis.  VASCULAR MEASUREMENTS PERTINENT TO TAVR:  AORTA:  Minimal Aortic Diameter-12.0 x 10.3 mm  Severity of Aortic Calcification-mild  RIGHT PELVIS:  Right Common Iliac Artery -  Minimal Diameter-7.1 x 6.9 mm  Tortuosity-severe  Calcification-mild  Right External Iliac Artery -  Minimal Diameter-5.8 x 5.8 mm  Tortuosity-mild-to-moderate  Calcification-none  Right Common Femoral Artery -  Minimal Diameter-6.6 x 6.0 mm  Tortuosity-mild  Calcification-none  LEFT PELVIS:  Left Common Iliac Artery -  Minimal Diameter-6.0 x 6.0 mm  Tortuosity-moderate to severe  Calcification-none  Left External Iliac Artery -  Minimal Diameter-5.7 x 5.5 mm  Tortuosity-mild  Calcification-none  Left Common Femoral Artery -  Minimal Diameter-6.7 x 6.0 mm  Tortuosity-mild  Calcification-none  Review of the MIP images confirms the above findings.  IMPRESSION:  1. Vascular findings and measurements pertinent to potential TAVR  procedure, as detailed.  2. Diffuse thickening and calcification of the aortic valve,  compatible with the reported history of severe symptomatic aortic  stenosis.  3. Borderline mild cardiomegaly.  4. Aortic Atherosclerosis (ICD10-I70.0).  Electronically Signed  By: JIlona SorrelM.D.  On: 05/11/2019 13:20    STS Risk Score: AVR   Risk of Mortality:  2.294%  Renal  Failure:  0.797%  Permanent Stroke:  1.610%  Prolonged Ventilation:  5.743%  DSW Infection:   0.046%  Reoperation:  3.959%  Morbidity or Mortality:  10.040%  Short Length of Stay:  45.320%  Long Length of Stay:  3.834%    Impression:   This 73 year old woman has stage D, severe, symptomatic aortic stenosis with New York Heart Association class III symptoms of exertional fatigue and shortness of breath as well as dizziness and chest discomfort consistent with chronic diastolic congestive heart failure. I have personally reviewed her 2D echocardiogram, cardiac catheterization, and CTA studies. Her 2D echocardiogram shows a severely calcified aortic valve with restricted leaflet mobility. The mean gradient is 41 mmHg with a valve area measured at 0.38 cm consistent with severe aortic stenosis. Left ventricular ejection fraction is normal. Cardiac catheterization shows no significant coronary disease with normal right heart pressures. I agree that aortic valve replacement is indicated in this active patient to improve her symptoms and prevent progressive left ventricular deterioration. She is a low risk surgical patient but would rather have transcatheter aortic valve replacement if possible to minimize her recovery time. Her gated cardiac CTA shows anatomy suitable for transcatheter aortic valve replacement. Her annular area is relatively small at 328 mm which would only allow a 20 mm SAPIEN 3 valve. I think a 26 mm Medtronic Evolut valve would be a better option for her. She does have some asymmetric calcification below the left coronary leaflet extending down into the left ventricular outflow tract which could increase her risk of perivalvular leak. Her abdominal and pelvic CTA shows adequate pelvic vascular anatomy to allow transfemoral insertion.  The patient was counseled at length regarding treatment alternatives for management of severe symptomatic aortic stenosis. The risks and benefits of surgical intervention has been discussed in detail. Long-term prognosis with medical therapy was  discussed. Alternative approaches such as conventional surgical aortic valve replacement, transcatheter aortic valve replacement, and palliative medical therapy were compared and contrasted at length. This discussion was placed in the context of the patient's own specific clinical presentation and past medical history. All of her questions have been addressed.   Following the decision to proceed with transcatheter aortic valve replacement, a discussion was held regarding what types of management strategies would be attempted intraoperatively in the event of life-threatening complications, including whether or not the patient would be considered a candidate for the use of cardiopulmonary bypass and/or conversion to open sternotomy for attempted surgical intervention. I think she is a candidate for emergent sternotomy to manage any intraoperative complications.   The patient has been advised of a variety of complications that might develop including but not limited to risks of death, stroke, paravalvular leak, aortic dissection or other major vascular complications, aortic annulus rupture, device embolization, cardiac rupture or perforation, mitral regurgitation, acute myocardial infarction, arrhythmia, heart block or bradycardia requiring permanent pacemaker placement, congestive heart failure, respiratory failure, renal failure, pneumonia, infection, other late complications related to structural valve deterioration or migration, or other complications that might ultimately cause a temporary or permanent loss of functional independence or other long term morbidity. The patient provides full informed consent for the procedure as described and all questions were answered.   Plan:   Transfemoral transcatheter aortic valve replacement using a Medtronic Evolut Pro valve.  Gaye Pollack, MD

## 2019-06-07 ENCOUNTER — Inpatient Hospital Stay (HOSPITAL_COMMUNITY): Payer: Medicare Other

## 2019-06-07 ENCOUNTER — Other Ambulatory Visit: Payer: Self-pay | Admitting: Physician Assistant

## 2019-06-07 ENCOUNTER — Other Ambulatory Visit: Payer: Self-pay

## 2019-06-07 ENCOUNTER — Inpatient Hospital Stay (HOSPITAL_COMMUNITY): Payer: Medicare Other | Admitting: Physician Assistant

## 2019-06-07 ENCOUNTER — Encounter (HOSPITAL_COMMUNITY)
Admission: RE | Disposition: A | Discharge status: Home / Self Care | Payer: Self-pay | Source: Home / Self Care | Attending: Surgery

## 2019-06-07 ENCOUNTER — Encounter (HOSPITAL_COMMUNITY): Payer: Self-pay | Admitting: Cardiovascular Disease

## 2019-06-07 ENCOUNTER — Inpatient Hospital Stay (HOSPITAL_COMMUNITY): Payer: Medicare Other | Admitting: Anesthesiology

## 2019-06-07 ENCOUNTER — Inpatient Hospital Stay (HOSPITAL_COMMUNITY)
Admission: RE | Admit: 2019-06-07 | Discharge: 2019-06-08 | DRG: 267 | Disposition: A | Discharge status: Home / Self Care | Payer: Medicare Other | Attending: Surgery | Admitting: Surgery

## 2019-06-07 DIAGNOSIS — Z7982 Long term (current) use of aspirin: Secondary | ICD-10-CM | POA: Diagnosis not present

## 2019-06-07 DIAGNOSIS — Z803 Family history of malignant neoplasm of breast: Secondary | ICD-10-CM

## 2019-06-07 DIAGNOSIS — I447 Left bundle-branch block, unspecified: Secondary | ICD-10-CM | POA: Diagnosis not present

## 2019-06-07 DIAGNOSIS — I350 Nonrheumatic aortic (valve) stenosis: Secondary | ICD-10-CM

## 2019-06-07 DIAGNOSIS — Z952 Presence of prosthetic heart valve: Secondary | ICD-10-CM

## 2019-06-07 DIAGNOSIS — K219 Gastro-esophageal reflux disease without esophagitis: Secondary | ICD-10-CM | POA: Diagnosis present

## 2019-06-07 DIAGNOSIS — I10 Essential (primary) hypertension: Secondary | ICD-10-CM | POA: Diagnosis present

## 2019-06-07 DIAGNOSIS — Z20828 Contact with and (suspected) exposure to other viral communicable diseases: Secondary | ICD-10-CM | POA: Diagnosis present

## 2019-06-07 DIAGNOSIS — I4510 Unspecified right bundle-branch block: Secondary | ICD-10-CM | POA: Diagnosis present

## 2019-06-07 DIAGNOSIS — M810 Age-related osteoporosis without current pathological fracture: Secondary | ICD-10-CM | POA: Diagnosis present

## 2019-06-07 DIAGNOSIS — Z006 Encounter for examination for normal comparison and control in clinical research program: Secondary | ICD-10-CM

## 2019-06-07 DIAGNOSIS — Z79899 Other long term (current) drug therapy: Secondary | ICD-10-CM | POA: Diagnosis not present

## 2019-06-07 DIAGNOSIS — Z8249 Family history of ischemic heart disease and other diseases of the circulatory system: Secondary | ICD-10-CM | POA: Diagnosis not present

## 2019-06-07 HISTORY — DX: Presence of prosthetic heart valve: Z95.2

## 2019-06-07 HISTORY — DX: Unspecified right bundle-branch block: I45.10

## 2019-06-07 HISTORY — PX: INTRAOPERATIVE TRANSTHORACIC ECHOCARDIOGRAM: SHX6523

## 2019-06-07 HISTORY — PX: TRANSCATHETER AORTIC VALVE REPLACEMENT, TRANSFEMORAL: SHX6400

## 2019-06-07 LAB — POCT I-STAT, CHEM 8
BUN: 14 mg/dL (ref 8–23)
BUN: 14 mg/dL (ref 8–23)
BUN: 13 mg/dL (ref 8–23)
Calcium, Ion: 1.22 mmol/L (ref 1.15–1.40)
Calcium, Ion: 1.22 mmol/L (ref 1.15–1.40)
Calcium, Ion: 1.2 mmol/L (ref 1.15–1.40)
Chloride: 101 mmol/L (ref 98–111)
Chloride: 102 mmol/L (ref 98–111)
Chloride: 102 mmol/L (ref 98–111)
Creatinine, Ser: 0.5 mg/dL (ref 0.44–1.00)
Creatinine, Ser: 0.4 mg/dL — ABNORMAL LOW (ref 0.44–1.00)
Creatinine, Ser: 0.5 mg/dL (ref 0.44–1.00)
Glucose, Bld: 114 mg/dL — ABNORMAL HIGH (ref 70–99)
Glucose, Bld: 122 mg/dL — ABNORMAL HIGH (ref 70–99)
Glucose, Bld: 121 mg/dL — ABNORMAL HIGH (ref 70–99)
HCT: 30 % — ABNORMAL LOW (ref 36.0–46.0)
HCT: 27 % — ABNORMAL LOW (ref 36.0–46.0)
HCT: 26 % — ABNORMAL LOW (ref 36.0–46.0)
Hemoglobin: 10.2 g/dL — ABNORMAL LOW (ref 12.0–15.0)
Hemoglobin: 9.2 g/dL — ABNORMAL LOW (ref 12.0–15.0)
Hemoglobin: 8.8 g/dL — ABNORMAL LOW (ref 12.0–15.0)
Potassium: 4 mmol/L (ref 3.5–5.1)
Potassium: 3.8 mmol/L (ref 3.5–5.1)
Potassium: 3.8 mmol/L (ref 3.5–5.1)
Sodium: 135 mmol/L (ref 135–145)
Sodium: 135 mmol/L (ref 135–145)
Sodium: 136 mmol/L (ref 135–145)
TCO2: 24 mmol/L (ref 22–32)
TCO2: 24 mmol/L (ref 22–32)
TCO2: 24 mmol/L (ref 22–32)

## 2019-06-07 SURGERY — IMPLANTATION, AORTIC VALVE, TRANSCATHETER, FEMORAL APPROACH
Anesthesia: Monitor Anesthesia Care | Site: Chest

## 2019-06-07 MED ORDER — LORATADINE 10 MG PO TABS
10.0000 mg | ORAL_TABLET | Freq: Every evening | ORAL | Status: DC
  Administered 2019-06-07: 17:00:00 10 mg via ORAL
  Filled 2019-06-07: qty 1

## 2019-06-07 MED ORDER — TRAMADOL HCL 50 MG PO TABS: 50.0000 mg | ORAL_TABLET | ORAL | Status: DC | PRN

## 2019-06-07 MED ORDER — LIDOCAINE 2% (20 MG/ML) 5 ML SYRINGE
INTRAMUSCULAR | Status: AC
  Filled 2019-06-07: qty 5

## 2019-06-07 MED ORDER — SODIUM CHLORIDE 0.9 % IV SOLN: 250.0000 mL | INTRAVENOUS | Status: DC | PRN

## 2019-06-07 MED ORDER — CHLORHEXIDINE GLUCONATE 4 % EX LIQD: 60.0000 mL | Freq: Once | CUTANEOUS | Status: DC

## 2019-06-07 MED ORDER — ASPIRIN EC 81 MG PO TBEC
81.0000 mg | DELAYED_RELEASE_TABLET | Freq: Every day | ORAL | Status: DC
  Administered 2019-06-08: 81 mg via ORAL
  Filled 2019-06-07 (×2): qty 1

## 2019-06-07 MED ORDER — FENTANYL CITRATE (PF) 250 MCG/5ML IJ SOLN
INTRAMUSCULAR | Status: DC | PRN
  Administered 2019-06-07: 50 ug via INTRAVENOUS

## 2019-06-07 MED ORDER — SODIUM CHLORIDE 0.9 % IV SOLN
INTRAVENOUS | Status: AC
  Filled 2019-06-07 (×3): qty 1.2

## 2019-06-07 MED ORDER — SODIUM CHLORIDE 0.9 % IV SOLN
1.5000 g | Freq: Two times a day (BID) | INTRAVENOUS | Status: DC
  Administered 2019-06-07 – 2019-06-08 (×2): 1.5 g via INTRAVENOUS
  Filled 2019-06-07 (×3): qty 1.5

## 2019-06-07 MED ORDER — LIDOCAINE HCL 1 % IJ SOLN
INTRAMUSCULAR | Status: DC | PRN
  Administered 2019-06-07: 4 mL

## 2019-06-07 MED ORDER — LIDOCAINE HCL (PF) 1 % IJ SOLN
INTRAMUSCULAR | Status: AC
  Filled 2019-06-07: qty 30

## 2019-06-07 MED ORDER — ACETAMINOPHEN 650 MG RE SUPP: 650.0000 mg | Freq: Four times a day (QID) | RECTAL | Status: DC | PRN

## 2019-06-07 MED ORDER — ONDANSETRON HCL 4 MG/2ML IJ SOLN: 4.0000 mg | Freq: Four times a day (QID) | INTRAMUSCULAR | Status: DC | PRN

## 2019-06-07 MED ORDER — SODIUM CHLORIDE 0.9 % IV SOLN
1.5000 g | Freq: Two times a day (BID) | INTRAVENOUS | Status: DC
  Filled 2019-06-07 (×2): qty 1.5

## 2019-06-07 MED ORDER — SODIUM CHLORIDE 0.9% FLUSH
3.0000 mL | Freq: Two times a day (BID) | INTRAVENOUS | Status: DC
  Administered 2019-06-08: 09:00:00 3 mL via INTRAVENOUS

## 2019-06-07 MED ORDER — ACETAMINOPHEN 325 MG PO TABS
650.0000 mg | ORAL_TABLET | Freq: Four times a day (QID) | ORAL | Status: DC | PRN
  Administered 2019-06-07: 15:00:00 650 mg via ORAL
  Filled 2019-06-07: qty 2

## 2019-06-07 MED ORDER — LACTATED RINGERS IV SOLN: INTRAVENOUS | Status: DC | PRN

## 2019-06-07 MED ORDER — CHLORHEXIDINE GLUCONATE 0.12 % MT SOLN
15.0000 mL | Freq: Once | OROMUCOSAL | Status: AC
  Administered 2019-06-07: 06:00:00 15 mL via OROMUCOSAL
  Filled 2019-06-07: qty 15

## 2019-06-07 MED ORDER — HEPARIN SODIUM (PORCINE) 1000 UNIT/ML IJ SOLN
INTRAMUSCULAR | Status: AC
  Filled 2019-06-07: qty 1

## 2019-06-07 MED ORDER — HEPARIN SODIUM (PORCINE) 1000 UNIT/ML IJ SOLN
INTRAMUSCULAR | Status: DC | PRN
  Administered 2019-06-07: 7000 [IU] via INTRAVENOUS

## 2019-06-07 MED ORDER — SODIUM CHLORIDE 0.9 % IV SOLN: INTRAVENOUS | Status: DC

## 2019-06-07 MED ORDER — OXYCODONE HCL 5 MG PO TABS: 5.0000 mg | ORAL_TABLET | ORAL | Status: DC | PRN

## 2019-06-07 MED ORDER — FAMOTIDINE 20 MG PO TABS
20.0000 mg | ORAL_TABLET | Freq: Every day | ORAL | Status: DC
  Administered 2019-06-07 – 2019-06-08 (×2): 20 mg via ORAL
  Filled 2019-06-07 (×2): qty 1

## 2019-06-07 MED ORDER — VANCOMYCIN HCL IN DEXTROSE 1-5 GM/200ML-% IV SOLN
1000.0000 mg | Freq: Once | INTRAVENOUS | Status: AC
  Administered 2019-06-07: 22:00:00 1000 mg via INTRAVENOUS
  Filled 2019-06-07: qty 200

## 2019-06-07 MED ORDER — LEVOCETIRIZINE DIHYDROCHLORIDE 5 MG PO TABS: 5.0000 mg | ORAL_TABLET | Freq: Every evening | ORAL | Status: DC

## 2019-06-07 MED ORDER — PROPOFOL 10 MG/ML IV BOLUS
INTRAVENOUS | Status: AC
  Filled 2019-06-07: qty 20

## 2019-06-07 MED ORDER — SODIUM CHLORIDE 0.9 % IV SOLN: INTRAVENOUS | Status: AC

## 2019-06-07 MED ORDER — PHENYLEPHRINE 40 MCG/ML (10ML) SYRINGE FOR IV PUSH (FOR BLOOD PRESSURE SUPPORT)
PREFILLED_SYRINGE | INTRAVENOUS | Status: DC | PRN
  Administered 2019-06-07: 40 ug via INTRAVENOUS

## 2019-06-07 MED ORDER — MIDAZOLAM HCL 2 MG/2ML IJ SOLN
INTRAMUSCULAR | Status: DC | PRN
  Administered 2019-06-07 (×2): 1 mg via INTRAVENOUS

## 2019-06-07 MED ORDER — MIDAZOLAM HCL 2 MG/2ML IJ SOLN
INTRAMUSCULAR | Status: AC
  Filled 2019-06-07: qty 2

## 2019-06-07 MED ORDER — VANCOMYCIN HCL IN DEXTROSE 1-5 GM/200ML-% IV SOLN
1000.0000 mg | Freq: Once | INTRAVENOUS | Status: DC
  Filled 2019-06-07: qty 200

## 2019-06-07 MED ORDER — MORPHINE SULFATE (PF) 2 MG/ML IV SOLN: 1.0000 mg | INTRAVENOUS | Status: DC | PRN

## 2019-06-07 MED ORDER — FENTANYL CITRATE (PF) 250 MCG/5ML IJ SOLN
INTRAMUSCULAR | Status: AC
  Filled 2019-06-07: qty 5

## 2019-06-07 MED ORDER — SODIUM CHLORIDE 0.9% FLUSH: 3.0000 mL | INTRAVENOUS | Status: DC | PRN

## 2019-06-07 MED ORDER — SODIUM CHLORIDE 0.9 % IV SOLN: INTRAVENOUS | Status: DC | PRN

## 2019-06-07 MED ORDER — CHLORHEXIDINE GLUCONATE 4 % EX LIQD: 30.0000 mL | CUTANEOUS | Status: DC

## 2019-06-07 MED ORDER — PHENYLEPHRINE HCL-NACL 20-0.9 MG/250ML-% IV SOLN
0.0000 ug/min | INTRAVENOUS | Status: DC
  Filled 2019-06-07: qty 250

## 2019-06-07 MED ORDER — CLOPIDOGREL BISULFATE 75 MG PO TABS
75.0000 mg | ORAL_TABLET | Freq: Every day | ORAL | Status: DC
  Administered 2019-06-08: 09:00:00 75 mg via ORAL
  Filled 2019-06-07: qty 1

## 2019-06-07 MED ORDER — NITROGLYCERIN IN D5W 200-5 MCG/ML-% IV SOLN: 0.0000 ug/min | INTRAVENOUS | Status: DC

## 2019-06-07 MED ORDER — PROTAMINE SULFATE 10 MG/ML IV SOLN
INTRAVENOUS | Status: DC | PRN
  Administered 2019-06-07: 70 mg via INTRAVENOUS

## 2019-06-07 MED ORDER — IODIXANOL 320 MG/ML IV SOLN
INTRAVENOUS | Status: DC | PRN
  Administered 2019-06-07: 110 mL

## 2019-06-07 SURGICAL SUPPLY — 90 items
BAG DECANTER FOR FLEXI CONT (MISCELLANEOUS) IMPLANT
BAG SNAP BAND KOVER 36X36 (MISCELLANEOUS) ×8 IMPLANT
BALLN TRUE 18X4.5 (BALLOONS) ×3
BALLN TRUE 18X4.5CM (BALLOONS) ×1
BALLOON TRUE 18X4.5 (BALLOONS) ×2 IMPLANT
BLADE CLIPPER SURG (BLADE) IMPLANT
BLADE OSCILLATING /SAGITTAL (BLADE) IMPLANT
BLADE STERNUM SYSTEM 6 (BLADE) IMPLANT
BLADE SURG 10 STRL SS (BLADE) IMPLANT
CABLE ADAPT CONN TEMP 6FT (ADAPTER) ×4 IMPLANT
CATH DIAG 6FR PIGTAIL (CATHETERS) ×4 IMPLANT
CATH DIAG EXPO 6F AL1 (CATHETERS) IMPLANT
CATH DIAG EXPO 6F VENT PIG 145 (CATHETERS) ×8 IMPLANT
CATH EXTERNAL FEMALE PUREWICK (CATHETERS) IMPLANT
CATH INFINITI 6F AL2 (CATHETERS) IMPLANT
CATH S G BIP PACING (CATHETERS) ×4 IMPLANT
CHLORAPREP W/TINT 26 (MISCELLANEOUS) ×4 IMPLANT
CLIP VESOCCLUDE MED 24/CT (CLIP) IMPLANT
CLIP VESOCCLUDE SM WIDE 24/CT (CLIP) IMPLANT
CLOSURE MYNX CONTROL 6F/7F (Vascular Products) ×4 IMPLANT
CONT SPEC 4OZ CLIKSEAL STRL BL (MISCELLANEOUS) ×8 IMPLANT
COVER BACK TABLE 80X110 HD (DRAPES) ×4 IMPLANT
COVER WAND RF STERILE (DRAPES) ×4 IMPLANT
DECANTER SPIKE VIAL GLASS SM (MISCELLANEOUS) ×4 IMPLANT
DERMABOND ADVANCED (GAUZE/BANDAGES/DRESSINGS) ×2
DERMABOND ADVANCED .7 DNX12 (GAUZE/BANDAGES/DRESSINGS) ×2 IMPLANT
DEVICE CLOSURE PERCLS PRGLD 6F (VASCULAR PRODUCTS) ×4 IMPLANT
DRAPE INCISE IOBAN 66X45 STRL (DRAPES) IMPLANT
DRSG TEGADERM 4X4.75 (GAUZE/BANDAGES/DRESSINGS) ×8 IMPLANT
DRYSEAL FLEXSHEATH 18FR 33CM (SHEATH) ×2
ELECT CAUTERY BLADE 6.4 (BLADE) IMPLANT
ELECT REM PT RETURN 9FT ADLT (ELECTROSURGICAL) ×8
ELECTRODE REM PT RTRN 9FT ADLT (ELECTROSURGICAL) ×4 IMPLANT
FELT TEFLON 6X6 (MISCELLANEOUS) IMPLANT
GAUZE 4X4 16PLY RFD (DISPOSABLE) ×4 IMPLANT
GAUZE SPONGE 4X4 12PLY STRL (GAUZE/BANDAGES/DRESSINGS) ×4 IMPLANT
GAUZE SPONGE 4X4 12PLY STRL LF (GAUZE/BANDAGES/DRESSINGS) ×8 IMPLANT
GLOVE BIO SURGEON STRL SZ7.5 (GLOVE) ×4 IMPLANT
GLOVE BIO SURGEON STRL SZ8 (GLOVE) IMPLANT
GLOVE EUDERMIC 7 POWDERFREE (GLOVE) IMPLANT
GLOVE ORTHO TXT STRL SZ7.5 (GLOVE) IMPLANT
GOWN STRL REUS W/ TWL LRG LVL3 (GOWN DISPOSABLE) IMPLANT
GOWN STRL REUS W/ TWL XL LVL3 (GOWN DISPOSABLE) ×2 IMPLANT
GOWN STRL REUS W/TWL LRG LVL3 (GOWN DISPOSABLE)
GOWN STRL REUS W/TWL XL LVL3 (GOWN DISPOSABLE) ×2
GUIDEWIRE CNFDA BRKR CVD (WIRE) ×4 IMPLANT
GUIDEWIRE SAFE TJ AMPLATZ EXST (WIRE) IMPLANT
INSERT FOGARTY SM (MISCELLANEOUS) IMPLANT
KIT BASIN OR (CUSTOM PROCEDURE TRAY) ×4 IMPLANT
KIT HEART LEFT (KITS) ×4 IMPLANT
KIT SUCTION CATH 14FR (SUCTIONS) IMPLANT
KIT TURNOVER KIT B (KITS) ×4 IMPLANT
LOOP VESSEL MAXI BLUE (MISCELLANEOUS) IMPLANT
LOOP VESSEL MINI RED (MISCELLANEOUS) IMPLANT
NS IRRIG 1000ML POUR BTL (IV SOLUTION) ×4 IMPLANT
PACK ENDO MINOR (CUSTOM PROCEDURE TRAY) ×4 IMPLANT
PAD ARMBOARD 7.5X6 YLW CONV (MISCELLANEOUS) ×8 IMPLANT
PAD ELECT DEFIB RADIOL ZOLL (MISCELLANEOUS) ×4 IMPLANT
PENCIL BUTTON HOLSTER BLD 10FT (ELECTRODE) IMPLANT
PERCLOSE PROGLIDE 6F (VASCULAR PRODUCTS) ×8
POSITIONER HEAD DONUT 9IN (MISCELLANEOUS) ×4 IMPLANT
SET MICROPUNCTURE 5F STIFF (MISCELLANEOUS) ×4 IMPLANT
SHEATH BRITE TIP 7FR 35CM (SHEATH) ×4 IMPLANT
SHEATH DRYSEAL FLEX 18FR 33CM (SHEATH) ×2 IMPLANT
SHEATH PINNACLE 6F 10CM (SHEATH) ×4 IMPLANT
SHEATH PINNACLE 8F 10CM (SHEATH) ×4 IMPLANT
SLEEVE REPOSITIONING LENGTH 30 (MISCELLANEOUS) ×4 IMPLANT
SPONGE LAP 18X18 RF (DISPOSABLE) ×4 IMPLANT
STOPCOCK MORSE 400PSI 3WAY (MISCELLANEOUS) ×8 IMPLANT
SUT ETHIBOND X763 2 0 SH 1 (SUTURE) IMPLANT
SUT GORETEX CV 4 TH 22 36 (SUTURE) IMPLANT
SUT GORETEX CV4 TH-18 (SUTURE) IMPLANT
SUT MNCRL AB 3-0 PS2 18 (SUTURE) IMPLANT
SUT PROLENE 5 0 C 1 36 (SUTURE) IMPLANT
SUT PROLENE 6 0 C 1 30 (SUTURE) IMPLANT
SUT SILK  1 MH (SUTURE) ×2
SUT SILK 1 MH (SUTURE) ×2 IMPLANT
SUT VIC AB 2-0 CT1 27 (SUTURE)
SUT VIC AB 2-0 CT1 TAPERPNT 27 (SUTURE) IMPLANT
SUT VIC AB 2-0 CTX 36 (SUTURE) IMPLANT
SUT VIC AB 3-0 SH 8-18 (SUTURE) IMPLANT
SYR 50ML LL SCALE MARK (SYRINGE) ×4 IMPLANT
SYR BULB IRRIGATION 50ML (SYRINGE) IMPLANT
SYR MEDRAD MARK V 150ML (SYRINGE) ×4 IMPLANT
TOWEL GREEN STERILE (TOWEL DISPOSABLE) ×8 IMPLANT
TRANSDUCER W/STOPCOCK (MISCELLANEOUS) ×8 IMPLANT
TRAY FOLEY SLVR 16FR TEMP STAT (SET/KITS/TRAYS/PACK) IMPLANT
VALVE AORTIC EVOLUT PROPLUS 26 (Valve) ×4 IMPLANT
WIRE EMERALD 3MM-J .035X150CM (WIRE) ×4 IMPLANT
WIRE EMERALD 3MM-J .035X260CM (WIRE) ×4 IMPLANT

## 2019-06-07 NOTE — Progress Notes (Signed)
20 g l brachial arterial line was pulled, and manual pressure was held for 8 min.  Sterile gauze was applied at the site, which is level 0.

## 2019-06-07 NOTE — Progress Notes (Signed)
Patient arrived from PACU to 4E11 after TAVR w/Bartle.  Telemetry monitor applied and CCMD notified.  CHG bath done.  Bilateral groin sites level 0.  Patient oriented to unit and room to include call light and phone.  Will continue to monitor.

## 2019-06-07 NOTE — Discharge Instructions (Signed)
ACTIVITY AND EXERCISE °• Daily activity and exercise are an important part of your recovery. People recover at different rates depending on their general health and type of valve procedure. °• Most people recovering from TAVR feel better relatively quickly  °• No lifting, pushing, pulling more than 10 pounds (examples to avoid: groceries, vacuuming, gardening, golfing): °            - For one week with a procedure through the groin. °            - For six weeks for procedures through the chest wall or neck. °NOTE: You will typically see one of our providers 7-14 days after your procedure to discuss WHEN TO RESUME the above activities.  °  °  °DRIVING °• Do not drive until you are seen for follow up and cleared by a provider. Generally, we ask patient to not drive for 1 week after their procedure. °• If you have been told by your doctor in the past that you may not drive, you must talk with him/her before you begin driving again. °  °DRESSING °• Groin site: you may leave the clear dressing over the site for up to one week or until it falls off. °  °HYGIENE °• If you had a femoral (leg) procedure, you may take a shower when you return home. After the shower, pat the site dry. Do NOT use powder, oils or lotions in your groin area until the site has completely healed. °• If you had a chest procedure, you may shower when you return home unless specifically instructed not to by your discharging practitioner. °            - DO NOT scrub incision; pat dry with a towel. °            - DO NOT apply any lotions, oils, powders to the incision. °            - No tub baths / swimming for at least 2 weeks. °• If you notice any fevers, chills, increased pain, swelling, bleeding or pus, please contact your doctor. °  °ADDITIONAL INFORMATION °• If you are going to have an upcoming dental procedure, please contact our office as you will require antibiotics ahead of time to prevent infection on your heart valve.  ° ° °If you have any  questions or concerns you can call the structural heart phone during normal business hours 8am-4pm. If you have an urgent need after hours or weekends please call [phone] to talk to the on call provider for general cardiology. If you have an emergency that requires immediate attention, please call 911.  ° ° °After TAVR Checklist ° °Check  Test Description  ° Follow up appointment in 1-2 weeks  You will see our structural heart physician assistant, Katie Lizette Pazos. Your incision sites will be checked and you will be cleared to drive and resume all normal activities if you are doing well.    ° 1 month echo and follow up  You will have an echo to check on your new heart valve and be seen back in the office by Katie Draylon Mercadel. Many times the echo is not read by your appointment time, but Katie will call you later that day or the following day to report your results.  ° Follow up with your primary cardiologist You will need to be seen by your primary cardiologist in the following 3-6 months after your 1 month appointment in the valve   clinic. Often times your Plavix or Aspirin will be discontinued during this time, but this is decided on a case by case basis.   ° 1 year echo and follow up You will have another echo to check on your heart valve after 1 year and be seen back in the office by Katie Kimbrely Buckel. This your last structural heart visit.  ° Bacterial endocarditis prophylaxis  You will have to take antibiotics for the rest of your life before all dental procedures (even teeth cleanings) to protect your heart valve. Antibiotics are also required before some surgeries. Please check with your cardiologist before scheduling any surgeries. Also, please make sure to tell us if you have a penicillin allergy as you will require an alternative antibiotic.   ° ° °

## 2019-06-07 NOTE — Interval H&P Note (Signed)
History and Physical Interval Note:  06/07/2019 6:54 AM  Crystal Rose  has presented today for surgery, with the diagnosis of Severe Aortic Stenosis.  The various methods of treatment have been discussed with the patient and family. After consideration of risks, benefits and other options for treatment, the patient has consented to  Procedure(s): TRANSCATHETER AORTIC VALVE REPLACEMENT, TRANSFEMORAL (N/A) TRANSESOPHAGEAL ECHOCARDIOGRAM (TEE) (N/A) as a surgical intervention.  The patient's history has been reviewed, patient examined, no change in status, stable for surgery.  I have reviewed the patient's chart and labs.  Questions were answered to the patient's satisfaction.     Gaye Pollack

## 2019-06-07 NOTE — Progress Notes (Signed)
  [place] VALVE TEAM  Patient doing well s/p TAVR. She is hemodynamically stable. Groin sites stable. ECG with new LBBB (QRS 166ms) and no high grade block. Arterial line discontinued and transferred  to 4E. Plan for early ambulation after bedrest completed and hopeful discharge over the next 24-48 hours.   Angelena Form PA-C  [facility]  Pager [phone]

## 2019-06-07 NOTE — Transfer of Care (Signed)
Immediate Anesthesia Transfer of Care Note  Patient: Crystal Rose  Procedure(s) Performed: TRANSCATHETER AORTIC VALVE REPLACEMENT, TRANSFEMORAL (N/A Chest) TRANSTHORACIC ECHOCARDIOGRAM (TEE) (N/A )  Patient Location: Cath Lab  Anesthesia Type:MAC  Level of Consciousness: awake, alert  and oriented  Airway & Oxygen Therapy: Patient Spontanous Breathing and Patient connected to nasal cannula oxygen  Post-op Assessment: Report given to RN, Post -op Vital signs reviewed and stable and Patient moving all extremities  Post vital signs: Reviewed and stable  Last Vitals:  Vitals Value Taken Time  BP 89/34 06/07/19 1019  Temp    Pulse 69 06/07/19 1022  Resp 16 06/07/19 1022  SpO2 100 % 06/07/19 1022  Vitals shown include unvalidated device data.  Last Pain:  Vitals:   06/07/19 0609  TempSrc:   PainSc: 0-No pain      Patients Stated Pain Goal: 5 (47/34/03 7096)  Complications: No apparent anesthesia complications

## 2019-06-07 NOTE — Anesthesia Postprocedure Evaluation (Signed)
Anesthesia Post Note  Patient: Crystal Rose  Procedure(s) Performed: TRANSCATHETER AORTIC VALVE REPLACEMENT, TRANSFEMORAL (N/A Chest) TRANSTHORACIC ECHOCARDIOGRAM (TEE) (N/A )     Patient location during evaluation: PACU Anesthesia Type: MAC Level of consciousness: awake and alert, patient cooperative and oriented Pain management: pain level controlled Vital Signs Assessment: post-procedure vital signs reviewed and stable Respiratory status: spontaneous breathing, nonlabored ventilation, respiratory function stable and patient connected to nasal cannula oxygen Cardiovascular status: blood pressure returned to baseline and stable Postop Assessment: no apparent nausea or vomiting Anesthetic complications: no    Last Vitals:  Vitals:   06/07/19 1330 06/07/19 1400  BP: (!) 113/51 (!) 120/59  Pulse: 69 70  Resp: 12 16  Temp:    SpO2: 100% 99%    Last Pain:  Vitals:   06/07/19 1519  TempSrc:   PainSc: 1                  Prestyn Stanco,E. Thomas Mabry

## 2019-06-07 NOTE — Progress Notes (Signed)
  Echocardiogram 2D Echocardiogram limited TAVR has been performed.  Crystal Rose M 06/07/2019, 9:47 AM

## 2019-06-07 NOTE — Anesthesia Procedure Notes (Signed)
Arterial Line Insertion Start/End12/15/2020 7:05 AM, 06/07/2019 7:41 AM Performed by: Annye Asa, MD, anesthesiologist  Patient location: Pre-op. Preanesthetic checklist: patient identified, IV checked, site marked, risks and benefits discussed, surgical consent, monitors and equipment checked, pre-op evaluation, timeout performed and anesthesia consent Lidocaine 1% used for infiltration Left, brachial was placed Catheter size: 20 G Hand hygiene performed , maximum sterile barriers used  and Seldinger technique used  Attempts: 1 Procedure performed without using ultrasound guided technique. Following insertion, line sutured, dressing applied and Biopatch. Patient tolerated the procedure well with no immediate complications. Additional procedure comments: Several attempts, multiple CRNAs, then [place], unsuccessful radial catheterization.  Brachial arterial catheter: Timeout, sterile prep, drape, L antecub. 1% lido local, finder and trocar brachial artery 1st pass.  20ga cath placed over J wire. Biopatch and sterile dressing on.  Patient tolerated well.  VSS.  Jenita Seashore, MD.

## 2019-06-07 NOTE — Anesthesia Procedure Notes (Signed)
Procedure Name: MAC Date/Time: 06/07/2019 8:10 AM Performed by: Amadeo Garnet, CRNA Pre-anesthesia Checklist: Emergency Drugs available, Patient identified, Patient being monitored and Suction available Patient Re-evaluated:Patient Re-evaluated prior to induction Oxygen Delivery Method: Simple face mask Preoxygenation: Pre-oxygenation with 100% oxygen Induction Type: IV induction Placement Confirmation: positive ETCO2 Dental Injury: Teeth and Oropharynx as per pre-operative assessment

## 2019-06-07 NOTE — CV Procedure (Signed)
[facility]  TAVR OPERATIVE NOTE   Date of Procedure:  06/07/2019  Preoperative Diagnosis: Severe Aortic Stenosis   Postoperative Diagnosis: Same   Procedure:    Transcatheter Aortic Valve Replacement - Transfemoral Approach  Medtronic Evolut Pro THV (size 26 mm, model # [id number], serial # [id number])   Co-Surgeons:  Lauree Chandler, MD and Gaye Pollack, MD   Anesthesiologist:  Glennon Mac  Echocardiographer:  Croitoru  Pre-operative Echo Findings:  Severe aortic stenosis  Normal left ventricular systolic function  Post-operative Echo Findings:  No paravalvular leak  Normal left ventricular systolic function  BRIEF CLINICAL NOTE AND INDICATIONS FOR SURGERY  73 yo Rose with history of aortic stenosis who is here today for TAVR. She has been followed for moderate aortic stenosis in primary care for several years. Most recent echo in the [facility] system in August 2020 with normal LV size and function, LVEF=60-65%. Grade 1 diastolic dysfunction. There is evidence of aortic stenosis with mean gradient of Crystal mmHg, peak gradient 69 mmHg,  She was seen by Dr. Ellyn Hack 04/06/19 and described fatigue, dyspnea with exertion, chest pressure with maximal exertion and dizziness. Echo in our system 04/29/19 with LVEF=60-65%. The aortic valve leaflets are thickened and calcified with mean gradient 41 mmHg, peak gradient 70.6 mmHg, dimensionless index 0.23, AVA 0.38 cm2. There is mild AI and mild MR. Cardiac cath 04/29/19 with no evidence of CAD.   During the course of the patient's preoperative work up they have been evaluated comprehensively by a multidisciplinary team of specialists coordinated through the [facility] the [facility].  They have been demonstrated to suffer from symptomatic severe aortic stenosis as noted above. The patient has been counseled extensively as to the relative risks and benefits of all  options for the treatment of severe aortic stenosis including long term medical therapy, conventional surgery for aortic valve replacement, and transcatheter aortic valve replacement.  The patient has been independently evaluated by Dr. Cyndia Bent with CT surgery and they are felt to be at high risk for conventional surgical aortic valve replacement. The surgeon indicated the patient would be a poor candidate for conventional surgery. Based upon review of all of the patient's preoperative diagnostic tests they are felt to be candidate for transcatheter aortic valve replacement using the transfemoral approach as an alternative to high risk conventional surgery.    Following the decision to proceed with transcatheter aortic valve replacement, a discussion has been held regarding what types of management strategies would be attempted intraoperatively in the event of life-threatening complications, including whether or not the patient would be considered a candidate for the use of cardiopulmonary bypass and/or conversion to open sternotomy for attempted surgical intervention.  The patient has been advised of a variety of complications that might develop peculiar to this approach including but not limited to risks of death, stroke, paravalvular leak, aortic dissection or other major vascular complications, aortic annulus rupture, device embolization, cardiac rupture or perforation, acute myocardial infarction, arrhythmia, heart block or bradycardia requiring permanent pacemaker placement, congestive heart failure, respiratory failure, renal failure, pneumonia, infection, other late complications related to structural valve deterioration or migration, or other complications that might ultimately cause a temporary or permanent loss of functional independence or other long term morbidity.  The patient provides full informed consent for the procedure as described and all questions were answered preoperatively.    DETAILS  OF THE OPERATIVE PROCEDURE  PREPARATION:   The patient is  brought to the operating room on the above mentioned date and central monitoring was established by the anesthesia team including placement of a radial arterial line. The patient is placed in the supine position on the operating table.  Intravenous antibiotics are administered. Conscious sedation is used.   Baseline transthoracic echocardiogram was performed. The patient's chest, abdomen, both groins, and both lower extremities are prepared and draped in a sterile manner. A time out procedure is performed.   PERIPHERAL ACCESS:   Using the modified Seldinger technique, femoral arterial and venous access were obtained with placement of 6 Fr sheaths on the left side using u/s guidance.  A pigtail diagnostic catheter was passed through the femoral arterial sheath under fluoroscopic guidance into the aortic root.  A temporary transvenous pacemaker catheter was passed through the femoral venous sheath under fluoroscopic guidance into the right ventricle.  The pacemaker was tested to ensure stable lead placement and pacemaker capture. Aortic root angiography was performed in order to determine the optimal angiographic angle for valve deployment.  TRANSFEMORAL ACCESS:  A micropuncture kit was used to gain access to the right femoral artery using u/s guidacne. Position confirmed with angiography. Pre-closure with double ProGlide closure devices. The patient was heparinized systemically and ACT verified > 250 seconds.    A 18 Fr transfemoral Dry Seal sheath was introduced into the right femoral artery after progressively dilating over an Amplatz superstiff wire. An AL-1 catheter was used to direct a straight-tip exchange length wire across the native aortic valve into the left ventricle. This was exchanged out for a pigtail catheter and position was confirmed in the LV apex. Simultaneous LV and Ao pressures were recorded.  The pigtail catheter was then  exchanged for a Confida stiff wire in the LV apex.   TRANSCATHETER HEART VALVE DEPLOYMENT:  A Medtronic Evolut Pro THV size 26 mm was prepared per manufacturer's guidelines. The valve was advanced through the introducer sheath using normal technique into the ascending aorta and across the native valve. The valve was carefully positioned across the aortic valve annulus. The Evolut deployment system was then used to unsheath the valve. The valve was fully deployed while using the pacemaker to set the heart rate at 120 bpm. There is felt to be no paravalvular leak and no central aortic insufficiency.  The patient's hemodynamic recovery following valve deployment is good.  The deployment system and guidewire are both removed. Echo demostrated acceptable post-procedural gradients, stable mitral valve function, and no AI.   PROCEDURE COMPLETION:  The sheath was then removed and closure devices were completed. Protamine was administered once femoral arterial repair was complete. The temporary pacemaker, pigtail catheters and femoral sheaths were removed with a Mynx closure device placed in the right femoral artery and manual pressure used for venous hemostasis.   The patient tolerated the procedure well and is transported to the surgical intensive care in stable condition. There were no immediate intraoperative complications. All sponge instrument and needle counts are verified correct at completion of the operation.   No blood products were administered during the operation.  The patient received a total of 110 mL of intravenous contrast during the procedure.  Lauree Chandler MD 06/07/2019 10:11 AM

## 2019-06-07 NOTE — Progress Notes (Signed)
Pt ambulated in hallway approximately 400 feet without difficulty.  Tolerated ambulation well.  Will continue to monitor.

## 2019-06-07 NOTE — Op Note (Signed)
[facility]   MULTIDISCIPLINARY HEART VALVE TEAM   TAVR OPERATIVE NOTE    Crystal LivingLinda Rose [id number]   Date of Procedure:  06/07/2019  Preoperative Diagnosis: Severe Aortic Stenosis   Postoperative Diagnosis: Same   Procedure:    Transcatheter Aortic Valve Replacement - Percutaneous Right Transfemoral Approach  Medtronic CoreValve Evolut Pro + (size 26 mm,  serial # [id number])   Co-Surgeons:  Alleen BorneBryan K. Keirsten Matuska, MD and Verne Carrowhristopher McAlhany, MD   Anesthesiologist:  Jairo Benarswell Jackson, MD  Echocardiographer:  Thurmon FairMihai Croitoru, MD  Pre-operative Echo Findings:  Severe aortic stenosis  Normal left ventricular systolic function  Post-operative Echo Findings:  No paravalvular leak  Normal left ventricular systolic function   BRIEF CLINICAL NOTE AND INDICATIONS FOR SURGERY  This 73 year old woman has stage D, severe, symptomatic aortic stenosis with New York Heart Association class III symptoms of exertional fatigue and shortness of breath as well as dizziness and chest discomfort consistent with chronic diastolic congestive heart failure. I have personally reviewed her 2D echocardiogram, cardiac catheterization, and CTA studies. Her 2D echocardiogram shows a severely calcified aortic valve with restricted leaflet mobility. The mean gradient is 41 mmHg with a valve area measured at 0.38 cm consistent with severe aortic stenosis. Left ventricular ejection fraction is normal. Cardiac catheterization shows no significant coronary disease with normal right heart pressures. I agree that aortic valve replacement is indicated in this active patient to improve her symptoms and prevent progressive left ventricular deterioration. She is a low risk surgical patient but would rather have transcatheter aortic valve replacement if possible to minimize her recovery time. Her gated cardiac CTA shows anatomy suitable for transcatheter aortic valve replacement. Her annular area is relatively  small at 328 mm which would only allow a 20 mm SAPIEN 3 valve. I think a 26 mm Medtronic Evolut valve would be a better option for her. She does have some asymmetric calcification below the left coronary leaflet extending down into the left ventricular outflow tract which could increase her risk of perivalvular leak. Her abdominal and pelvic CTA shows adequate pelvic vascular anatomy to allow transfemoral insertion.  The patient was counseled at length regarding treatment alternatives for management of severe symptomatic aortic stenosis. The risks and benefits of surgical intervention has been discussed in detail. Long-term prognosis with medical therapy was discussed. Alternative approaches such as conventional surgical aortic valve replacement, transcatheter aortic valve replacement, and palliative medical therapy were compared and contrasted at length. This discussion was placed in the context of the patient's own specific clinical presentation and past medical history. All of her questions have been addressed.   Following the decision to proceed with transcatheter aortic valve replacement, a discussion was held regarding what types of management strategies would be attempted intraoperatively in the event of life-threatening complications, including whether or not the patient would be considered a candidate for the use of cardiopulmonary bypass and/or conversion to open sternotomy for attempted surgical intervention. I think she is a candidate for emergent sternotomy to manage any intraoperative complications.   The patient has been advised of a variety of complications that might develop including but not limited to risks of death, stroke, paravalvular leak, aortic dissection or other major vascular complications, aortic annulus rupture, device embolization, cardiac rupture or perforation, mitral regurgitation, acute myocardial infarction, arrhythmia, heart block or bradycardia requiring permanent  pacemaker placement, congestive heart failure, respiratory failure, renal failure, pneumonia, infection, other late complications related to structural valve deterioration or migration, or other complications that might  ultimately cause a temporary or permanent loss of functional independence or other long term morbidity. The patient provides full informed consent for the procedure as described and all questions were answered.   DETAILS OF THE OPERATIVE PROCEDURE  PREPARATION:    The patient is brought to the operating room on the above mentioned date and central monitoring was established by the anesthesia team including placement of a  radial arterial line. The patient is placed in the supine position on the operating table.  Intravenous antibiotics are administered.  Conscious sedation is used.  Baseline transthoracic echocardiogram was performed. The patient's abdomen and both groins are prepared and draped in a sterile manner. A time out procedure is performed.   PERIPHERAL ACCESS:    Using the modified Seldinger technique, femoral arterial and venous access was obtained with placement of 6 Fr sheaths on the left side.  A pigtail diagnostic catheter was passed through the left arterial sheath under fluoroscopic guidance into the aortic root.  A temporary transvenous pacemaker catheter was passed through the left femoral venous sheath under fluoroscopic guidance into the right ventricle.  The pacemaker was tested to ensure stable lead placement and pacemaker capture.   TRANSFEMORAL ACCESS:   Percutaneous transfemoral access and sheath placement was performed using ultrasound guidance. The right common femoral artery was cannulated using a micropuncture needle.  A pair of Abbott Perclose percutaneous closure devices were placed and a 8 French sheath replaced into the femoral artery.  The patient was heparinized systemically and ACT verified > 250 seconds.    A 18 Fr Dry-Seal sheath was  introduced into the right femoral artery after progressively dilating over an Amplatz superstiff wire. An AL-1 catheter was used to direct a straight-tip exchange length wire across the native aortic valve into the left ventricle. This was exchanged out for a pigtail catheter and position was confirmed in the LV apex. Simultaneous LV and Ao pressures were recorded.  The pigtail catheter was exchanged for a Confida wire in the LV apex.    BALLOON AORTIC VALVULOPLASTY:   Performed using an 61 F True balloon without difficulty.   TRANSCATHETER HEART VALVE DEPLOYMENT:   A Medtronic Evolut Pro+ transcatheter heart valve (size 26 mm, serial #[id number]) was prepared and crimped per manufacturer's guidelines, and the proper orientation of the valve is confirmed on the delivery system. The valve was advanced through the introducer sheath using normal technique until in an appropriate position in the abdominal aorta beyond the sheath tip. The valve was then advanced across the aortic arch. The valve was carefully positioned across the aortic valve annulus. The paralax was removed from the delivery system by moving the detector into the LAO postion.  The valve is carefully deployed using the retractor dial so that 80% of the valve is released. TEE demonstrated no paravalvular AI and the patient's heart rhythm was stable. The valve was fully released during pacing at 120 bpm. There was felt to be no paravalvular leak and no central aortic insufficiency. The prosthetic valve gradient was low. The patient's hemodynamic recovery following valve deployment is good.  The delivery catheter and guidewire were both removed.    PROCEDURE COMPLETION:   The sheath was removed and femoral artery closure performed using the previously placed Perclose devices. Protamine was administered once femoral arterial repair was complete. The temporary pacemaker, pigtail catheters and femoral sheaths were removed with manual pressure used  for hemostasis and a Mynx closure device placed in the right femoral artery.  The  patient tolerated the procedure well and is transported to the cath lab recovery area in stable condition. There were no immediate intraoperative complications. All sponge instrument and needle counts are verified correct at completion of the operation.   No blood products were administered during the operation.  The patient received a total of 110 mL of intravenous contrast during the procedure.   Gaye Pollack, MD 06/07/2019

## 2019-06-08 ENCOUNTER — Encounter: Payer: Self-pay | Admitting: *Deleted

## 2019-06-08 ENCOUNTER — Other Ambulatory Visit: Payer: Self-pay

## 2019-06-08 ENCOUNTER — Inpatient Hospital Stay (HOSPITAL_COMMUNITY): Payer: Medicare Other

## 2019-06-08 ENCOUNTER — Ambulatory Visit (INDEPENDENT_AMBULATORY_CARE_PROVIDER_SITE_OTHER): Payer: Medicare Other

## 2019-06-08 DIAGNOSIS — I447 Left bundle-branch block, unspecified: Secondary | ICD-10-CM | POA: Diagnosis not present

## 2019-06-08 DIAGNOSIS — I4510 Unspecified right bundle-branch block: Secondary | ICD-10-CM | POA: Diagnosis not present

## 2019-06-08 DIAGNOSIS — Z952 Presence of prosthetic heart valve: Secondary | ICD-10-CM

## 2019-06-08 LAB — CBC
HCT: 29.4 % — ABNORMAL LOW (ref 36.0–46.0)
Hemoglobin: 10.1 g/dL — ABNORMAL LOW (ref 12.0–15.0)
MCH: 33.2 pg (ref 26.0–34.0)
MCHC: 34.4 g/dL (ref 30.0–36.0)
MCV: 96.7 fL (ref 80.0–100.0)
Platelets: 214 10*3/uL (ref 150–400)
RBC: 3.04 MIL/uL — ABNORMAL LOW (ref 3.87–5.11)
RDW: 11.9 % (ref 11.5–15.5)
WBC: 6.6 10*3/uL (ref 4.0–10.5)
nRBC: 0 % (ref 0.0–0.2)

## 2019-06-08 LAB — BASIC METABOLIC PANEL
Anion gap: 8 (ref 5–15)
BUN: 15 mg/dL (ref 8–23)
CO2: 23 mmol/L (ref 22–32)
Calcium: 8.5 mg/dL — ABNORMAL LOW (ref 8.9–10.3)
Chloride: 104 mmol/L (ref 98–111)
Creatinine, Ser: 0.74 mg/dL (ref 0.44–1.00)
GFR calc Af Amer: >60 mL/min (ref >60)
GFR calc non Af Amer: >60 mL/min (ref >60)
Glucose, Bld: 112 mg/dL — ABNORMAL HIGH (ref 70–99)
Potassium: 3.9 mmol/L (ref 3.5–5.1)
Sodium: 135 mmol/L (ref 135–145)

## 2019-06-08 LAB — MAGNESIUM: Magnesium: 1.8 mg/dL (ref 1.7–2.4)

## 2019-06-08 LAB — ECHOCARDIOGRAM COMPLETE
Height: 58 in
Weight: 1788.8 oz

## 2019-06-08 MED ORDER — CLOPIDOGREL BISULFATE 75 MG PO TABS: 75.0000 mg | ORAL_TABLET | Freq: Every day | ORAL | 1 refills | Status: DC

## 2019-06-08 NOTE — Progress Notes (Signed)
CARDIAC REHAB PHASE I   PRE:  Rate/Rhythm: 90 SR    BP: sitting 125/67    SaO2: 97 RA  MODE:  Ambulation: 300 ft   POST:  Rate/Rhythm: 102 ST    BP: sitting 135/57     SaO2:   Pt to BR then ambulated hall with min assist. She slightly staggered, scissoring steps at times. Sts this is her norm and that she has never fallen. No c/o. Daughter in room. Encouraged pt to walk with daughter initially to ensure safety. HR with intermittent LBBB. Asx. Discussed walking at home, restrictions, and CRPII. She is interested in [place] and I will send referral to [facility] [phone]   [place], ACSM 06/08/2019 10:55 AM

## 2019-06-08 NOTE — Progress Notes (Signed)
Echocardiogram 2D Echocardiogram has been performed.  Oneal Deputy Andera Cranmer 06/08/2019, 10:10 AM

## 2019-06-08 NOTE — Discharge Summary (Addendum)
[facility]   MULTIDISCIPLINARY HEART VALVE TEAM  Discharge Summary    Patient ID: Crystal Rose MRN: [id number]; DOB: [birth year]  Admit date: 06/07/2019 Discharge date: 06/08/2019  Primary Care Provider: Marcell Anger, NP  Primary Cardiologist: Bryan Lemma, MD / Dr. Clifton James and Dr. Laneta Simmers (TAVR)  Discharge Diagnoses    Principal Problem:   S/P TAVR (transcatheter aortic valve replacement) Active Problems:   Severe aortic stenosis   Hypertension   RBBB   GERD (gastroesophageal reflux disease)   Allergies Allergies  Allergen Reactions   5-Alpha Reductase Inhibitors     Diagnostic Studies/Procedures    TAVR OPERATIVE NOTE  Date of Procedure:                06/07/2019  Preoperative Diagnosis:      Severe Aortic Stenosis   Postoperative Diagnosis:    Same   Procedure:        Transcatheter Aortic Valve Replacement - Percutaneous Right Transfemoral Approach             Medtronic CoreValve Evolut Pro + (size 26 mm,  serial # [id number])              Co-Surgeons:                        Alleen Borne, MD and Verne Carrow, MD   Anesthesiologist:                  Jairo Ben, MD  Echocardiographer:              Thurmon Fair, MD  Pre-operative Echo Findings: ? Severe aortic stenosis ? Normal left ventricular systolic function  Post-operative Echo Findings: ? No paravalvular leak ? Normal left ventricular systolic function  _____________    Echo 06/08/19: completed but pending formal read at the time of discharge    History of Present Illness     Crystal Rose is a 73 y.o. female with a history of IRBBB and severe AS who presented to [facility] on 06/07/19 for planned TAVR.  She has a history of moderate AS that was previously followed at [facility]. She established care with Dr. Herbie Baltimore in October of 2020 and reported exertional fatigue and shortness of breath as well as some chest pressure and dizziness. Echocardiogram on  04/29/2019 which showed a calcified aortic valve with restricted leaflet mobility.  The mean gradient was 41 mmHg with a peak gradient of 70.6 mmHg.  There was mild aortic insufficiency.  Left ventricular ejection fraction was normal.  She underwent cardiac catheterization on the same day which showed no coronary disease.  There were normal right heart pressures.    The patient has been evaluated by the multidisciplinary valve team and felt to have severe, symptomatic aortic stenosis and to be a suitable candidate for TAVR, which was set up for 06/07/19.     Hospital Course     Consultants: none   Severe AS: s/p successful TAVR with a 26 mm Medtronic CoreValve Evolut Pro + THV via the TF approach on 06/07/19. Post operative echo pending. Groin sites are stable. ECG immediately post op showed new LBBB. ECG today with NSR with old IRBBB today and no HAVB. Tele shows normal conduction with one missed beat. Given self expanding valve placement, chronic conduction disease and alternating bundle branch block on ECG, will plan for a Zio patch at discharge. Continued on ASA and started on plavix. I will see her back  in close follow up next week.   _____________  Discharge Vitals Blood pressure (!) 111/51, pulse 93, temperature 98.5 F (36.9 C), temperature source Oral, resp. rate 19, height  (1.473 m), weight 50.7 kg, SpO2 100 %.  Filed Weights   06/07/19 0556 06/07/19 1239 06/08/19 0500  Weight: 50.4 kg 53 kg 50.7 kg    GEN: Well nourished, well developed, in no acute distress HEENT: normal Neck: no JVD or masses Cardiac: RRR; no murmurs, rubs, or gallops,no edema  Respiratory:  clear to auscultation bilaterally, normal work of breathing GI: soft, nontender, nondistended, + BS MS: no deformity or atrophy Skin: warm and dry, no rash.  Groin sites clear without hematoma or ecchymosis  Neuro:  Alert and Oriented x 3, Strength and sensation are intact Psych: euthymic mood, full  affect    Labs & Radiologic Studies    CBC Recent Labs    06/07/19 1045 06/08/19 0401  WBC  --  6.6  HGB 8.8* 10.1*  HCT 26.0* 29.4*  MCV  --  96.7  PLT  --  214   Basic Metabolic Panel Recent Labs    40/98/11 1045 06/08/19 0401  NA 136 135  K 3.8 3.9  CL 102 104  CO2  --  23  GLUCOSE 121* 112*  BUN 13 15  CREATININE 0.50 0.74  CALCIUM  --  8.5*  MG  --  1.8   Liver Function Tests No results for input(s): AST, ALT, ALKPHOS, BILITOT, PROT, ALBUMIN in the last 72 hours. No results for input(s): LIPASE, AMYLASE in the last 72 hours. Cardiac Enzymes No results for input(s): CKTOTAL, CKMB, CKMBINDEX, TROPONINI in the last 72 hours. BNP Invalid input(s): POCBNP D-Dimer No results for input(s): DDIMER in the last 72 hours. Hemoglobin A1C No results for input(s): HGBA1C in the last 72 hours. Fasting Lipid Panel No results for input(s): CHOL, HDL, LDLCALC, TRIG, CHOLHDL, LDLDIRECT in the last 72 hours. Thyroid Function Tests No results for input(s): TSH, T4TOTAL, T3FREE, THYROIDAB in the last 72 hours.  Invalid input(s): FREET3 _____________  DG Chest 2 View  Result Date: 06/03/2019 CLINICAL DATA:  Preop TAVR EXAM: CHEST - 2 VIEW COMPARISON:  CT chest 05/11/2019 FINDINGS: There is no focal consolidation. There is no pleural effusion or pneumothorax. The heart and mediastinal contours are unremarkable. There is an S-shaped scoliosis of the thoracolumbar spine. There is a T9 vertebral body compression fracture. IMPRESSION: No active cardiopulmonary disease. Electronically Signed   By: Elige Ko   On: 06/03/2019 14:39   CT CORONARY MORPH W/CTA COR W/SCORE W/CA W/CM &/OR WO/CM  Addendum Date: 05/11/2019   ADDENDUM REPORT: 05/11/2019 13:16 CLINICAL DATA:  73 year old female with severe aortic stenosis being evaluated for a TAVR procedure. EXAM: Cardiac TAVR CT TECHNIQUE: The patient was scanned on a [employer]. A 120 kV retrospective scan was triggered in the  descending thoracic aorta at 111 HU's. Gantry rotation speed was 250 msecs and collimation was .6 mm. No beta blockade or nitro were given. The 3D data set was reconstructed in 5% intervals of the R-R cycle. Systolic and diastolic phases were analyzed on a dedicated work station using MPR, MIP and VRT modes. The patient received 80 cc of contrast. FINDINGS: Aortic Valve: Trileaflet aortic valve with severely thickened, moderately calcified leaflets with moderate asymmetric calcifications extending into the LVOT under the left coronary cusp. Aorta: Normal size with only mild diffuse atherosclerotic plaque and calcifications and no dissection. Sinotubular Junction: 22 x 21  mm Ascending Thoracic Aorta: 27 x 27 mm Aortic Arch: 24 x 23 mm Descending Thoracic Aorta: 19 x 19 mm Sinus of Valsalva Measurements: Non-coronary: 27 mm Right -coronary: 25 mm Left -coronary: 26 mm Coronary Artery Height above Annulus: Left Main: 12 mm Right Coronary: 15 mm Virtual Basal Annulus Measurements: Maximum/Minimum Diameter: 22.3 x 18.8 mm Mean Diameter: 20.4 mm Perimeter: 65.3 mm Area: 328 mm2 Optimum Fluoroscopic Angle for Delivery: LAO 13 CAU 9 IMPRESSION: 1. Trileaflet aortic valve with severely thickened, moderately calcified leaflets with moderate asymmetric calcifications extending into the LVOT under the left coronary cusp. Aortic valve calcium score is 2347 consistent with severe aortic stenosis. Annular measurements suitable for delivery of a 20 mm Edwards-SAPIEN 3 Ultra valve or a 26 mm CoreValve Evolut R valve. 2. Sufficient coronary to annulus distance. 3. Optimum Fluoroscopic Angle for Delivery: LAO 13 CAU 9. 4. No thrombus in the left atrial appendage. Electronically Signed   By: Tobias AlexanderKatarina  Nelson   On: 05/11/2019 13:16   Result Date: 05/11/2019 EXAM: OVER-READ INTERPRETATION  CT CHEST The following report is an over-read performed by radiologist Dr. Cleone SlimJason Poff [facility] on 05/11/2019. This over-read  does not include interpretation of cardiac or coronary anatomy or pathology. The coronary CTA interpretation by the cardiologist is attached. COMPARISON:  None. FINDINGS: Please see the separate concurrent chest CT angiogram report for details. IMPRESSION: Please see the separate concurrent chest CT angiogram report for details. Electronically Signed: By: Delbert PhenixJason A Poff M.D. On: 05/11/2019 12:38   DG Chest Port 1 View  Result Date: 06/07/2019 CLINICAL DATA:  Status post TAVR EXAM: PORTABLE CHEST 1 VIEW COMPARISON:  06/03/2019 FINDINGS: Bilateral interstitial thickening. No pleural effusion or pneumothorax. Stable cardiomediastinal silhouette. Interval TAVR. No acute osseous abnormality. IMPRESSION: Interval TAVR.  No acute cardiopulmonary disease. Electronically Signed   By: Elige KoHetal  Patel   On: 06/07/2019 14:30   CT ANGIO CHEST AORTA W &/OR WO CONTRAST  Result Date: 05/11/2019 CLINICAL DATA:  Severe symptomatic aortic stenosis. Pre-TAVR evaluation. EXAM: CT ANGIOGRAPHY CHEST, ABDOMEN AND PELVIS TECHNIQUE: Multidetector CT imaging through the chest, abdomen and pelvis was performed using the standard protocol during bolus administration of intravenous contrast. Multiplanar reconstructed images and MIPs were obtained and reviewed to evaluate the vascular anatomy. CONTRAST:  95mL OMNIPAQUE IOHEXOL 350 MG/ML SOLN COMPARISON:  None. FINDINGS: CTA CHEST FINDINGS Cardiovascular: Borderline mild cardiomegaly. Diffuse thickening and calcification of the aortic valve. No significant pericardial effusion/thickening. Atherosclerotic nonaneurysmal thoracic aorta. Normal caliber pulmonary arteries. No central pulmonary emboli. Mediastinum/Nodes: No discrete thyroid nodules. Unremarkable esophagus. No pathologically enlarged axillary, mediastinal or hilar lymph nodes. Lungs/Pleura: No pneumothorax. No pleural effusion. No acute consolidative airspace disease, lung masses or significant pulmonary nodules. Mosaic attenuation  throughout both lungs. Musculoskeletal: No aggressive appearing focal osseous lesions. Moderate T9 vertebral compression fracture, chronic appearing. Moderate thoracic spondylosis. CTA ABDOMEN AND PELVIS FINDINGS Hepatobiliary: Normal liver with no liver mass. Normal gallbladder with no radiopaque cholelithiasis. No biliary ductal dilatation. Pancreas: Normal, with no mass or duct dilation. Spleen: Normal size. No mass. Adrenals/Urinary Tract: Normal adrenals. No hydronephrosis. Subcentimeter hypodense renal cortical lesion in the anterior lower right kidney, too small to characterize, requiring no follow-up. No additional contour deforming renal lesions. Normal bladder. Stomach/Bowel: Normal non-distended stomach. Normal caliber small bowel with no small bowel wall thickening. Normal appendix. Normal large bowel with no diverticulosis, large bowel wall thickening or pericolonic fat stranding. Vascular/Lymphatic: Atherosclerotic nonaneurysmal abdominal aorta. No pathologically enlarged lymph nodes in the abdomen or  pelvis. Reproductive: Grossly normal uterus.  No adnexal mass. Other: No pneumoperitoneum, ascites or focal fluid collection. Musculoskeletal: No aggressive appearing focal osseous lesions. Marked lumbar spondylosis. VASCULAR MEASUREMENTS PERTINENT TO TAVR: AORTA: Minimal Aortic Diameter-12.0 x 10.3 mm Severity of Aortic Calcification-mild RIGHT PELVIS: Right Common Iliac Artery - Minimal Diameter-7.1 x 6.9 mm Tortuosity-severe Calcification-mild Right External Iliac Artery - Minimal Diameter-5.8 x 5.8 mm Tortuosity-mild-to-moderate Calcification-none Right Common Femoral Artery - Minimal Diameter-6.6 x 6.0 mm Tortuosity-mild Calcification-none LEFT PELVIS: Left Common Iliac Artery - Minimal Diameter-6.0 x 6.0 mm Tortuosity-moderate to severe Calcification-none Left External Iliac Artery - Minimal Diameter-5.7 x 5.5 mm Tortuosity-mild Calcification-none Left Common Femoral Artery - Minimal Diameter-6.7 x  6.0 mm Tortuosity-mild Calcification-none Review of the MIP images confirms the above findings. IMPRESSION: 1. Vascular findings and measurements pertinent to potential TAVR procedure, as detailed. 2. Diffuse thickening and calcification of the aortic valve, compatible with the reported history of severe symptomatic aortic stenosis. 3. Borderline mild cardiomegaly. 4. Aortic Atherosclerosis (ICD10-I70.0). Electronically Signed   By: Delbert Phenix M.D.   On: 05/11/2019 13:20   VAS US CAROTID  Result Date: 05/11/2019 Carotid Arterial Duplex Study Indications:       Pre-surgical evaluation. Other Factors:     Aortic valve disease. Comparison Study:  No prior study. Performing Technologist: Gertie Fey MHA, RDMS, RVT, RDCS  Examination Guidelines: A complete evaluation includes B-mode imaging, spectral Doppler, color Doppler, and power Doppler as needed of all accessible portions of each vessel. Bilateral testing is considered an integral part of a complete examination. Limited examinations for reoccurring indications may be performed as noted.  Right Carotid Findings: +----------+--------+-------+--------+--------------------------------+--------+             PSV cm/s EDV     Stenosis Plaque Description               Comments                       cm/s                                                        +----------+--------+-------+--------+--------------------------------+--------+  CCA Prox   96       18               smooth and heterogenous                    +----------+--------+-------+--------+--------------------------------+--------+  CCA Distal 75       12                                                          +----------+--------+-------+--------+--------------------------------+--------+  ICA Prox   65       20               smooth, heterogenous and  calcific                                    +----------+--------+-------+--------+--------------------------------+--------+  ICA Distal 66       18                                                          +----------+--------+-------+--------+--------------------------------+--------+  ECA        69       9                                                           +----------+--------+-------+--------+--------------------------------+--------+ +----------+--------+-------+----------------+-------------------+             PSV cm/s EDV cms Describe         Arm Pressure (mmHG)  +----------+--------+-------+----------------+-------------------+  Subclavian 104              Multiphasic, WNL                      +----------+--------+-------+----------------+-------------------+ +---------+--------+--+--------+--+---------+  Vertebral PSV cm/s 72 EDV cm/s 16 Antegrade  +---------+--------+--+--------+--+---------+  Left Carotid Findings: +----------+--------+-------+--------+----------------------+------------------+             PSV cm/s EDV     Stenosis Plaque Description     Comments                                 cm/s                                                        +----------+--------+-------+--------+----------------------+------------------+  CCA Prox   90       16                                                          +----------+--------+-------+--------+----------------------+------------------+  CCA Distal 81       16                                      intimal thickening  +----------+--------+-------+--------+----------------------+------------------+  ICA Prox   55       10               smooth and  heterogenous                               +----------+--------+-------+--------+----------------------+------------------+  ICA Distal 83       24                                                           +----------+--------+-------+--------+----------------------+------------------+  ECA        87       9                smooth and                                                                       heterogenous                               +----------+--------+-------+--------+----------------------+------------------+ +----------+--------+--------+----------------+-------------------+             PSV cm/s EDV cm/s Describe         Arm Pressure (mmHG)  +----------+--------+--------+----------------+-------------------+  Subclavian 119               Multiphasic, WNL                      +----------+--------+--------+----------------+-------------------+ +---------+--------+--+--------+-+---------+  Vertebral PSV cm/s 46 EDV cm/s 9 Antegrade  +---------+--------+--+--------+-+---------+  Summary: Right Carotid: Velocities in the right ICA are consistent with a 1-39% stenosis. Left Carotid: Velocities in the left ICA are consistent with a 1-39% stenosis. Vertebrals:  Bilateral vertebral arteries demonstrate antegrade flow. Subclavians: Normal flow hemodynamics were seen in bilateral subclavian              arteries. *See table(s) above for measurements and observations.  Electronically signed by Coral Else MD on 05/11/2019 at 1:52:31 PM.    Final    ECHOCARDIOGRAM LIMITED  Result Date: 06/07/2019   ECHOCARDIOGRAM LIMITED REPORT   Patient Name:   ANJALINA BERGEVIN Date of Exam: 06/07/2019 Medical Rec #:  [id number]   Height:       58.0 in Accession #:    [id number]  Weight:       111.1 lb Date of Birth:  1946/03/27   BSA:          1.42 m Patient Age:    73 years    BP:           174/61 mmHg Patient Gender: F           HR:           89 bpm. Exam Location:  Inpatient  Procedure: Limited Echo, Limited Color Doppler and Cardiac Doppler Indications:     Aortic Stenosis 424.1 / 135.0  History:         Patient has prior history of Echocardiogram examinations, most                  recent 04/29/2019. Aortic Valve: A 26  Medtronic CoreVlave-Evolut  Proplus Procedure Date: 06/07/2019 GERD.  Sonographer:     Leta Jungling RDCS Referring Phys:  [id number] Kathleene Hazel Diagnosing Phys: Thurmon Fair MD IMPRESSIONS   1. Left ventricular ejection fraction, by visual estimation, is 65 to 70%. The left ventricle has hyperdynamic function. There is mildly increased left ventricular hypertrophy.  2. Left ventricular diastolic function could not be evaluated.  3. The left ventricle has no regional wall motion abnormalities.  4. Global right ventricle has normal systolic function.The right ventricular size is mildly enlarged. No increase in right ventricular wall thickness.  5. Moderate mitral annular calcification.  6. The mitral valve is degenerative. No evidence of mitral valve regurgitation.  7. The tricuspid valve is not assessed. Tricuspid valve regurgitation is not demonstrated.  8. The aortic valve is tricuspid. Aortic valve regurgitation is not visualized. Severe aortic valve stenosis.  9. There is severe calcifcation of the aortic valve. 10. The pulmonic valve was not assessed. Pulmonic valve regurgitation is not visualized. 11. The interatrial septum was not assessed. 12. 13. 14. Preoperative study: hyperdynamic left ventricular systolic function, LVEF 65-70%. Normal regional wall motion. No pericardial effusion. Severe calcific aortic stenosis. Peak and mean gradients are 57 and 33 mm Hg, respectively. Calculated AV area by continuity equation is 0.5 cm sq. Dimensionless obstructive index 0.25. 15. Postoperative study: hyperdynamic left ventricular systolic function, LVEF 65-70%. Normal regional wall motion. No pericardial effusion. Well deployed stent-valve (TAVR). Peak and mean gradients are 6 and 3 mm Hg, respectively. Calculated AV area by continuity equation is 1.9 cm sq. Dimensionless obstructive index 0.94. There is no perivalvular leak. FINDINGS  Left Ventricle: Left ventricular ejection fraction, by  visual estimation, is 65 to 70%. The left ventricle has hyperdynamic function. The left ventricle has no regional wall motion abnormalities. The left ventricular internal cavity size was the left ventricle is normal in size. There is mildly increased left ventricular wall thickness. Concentric left ventricular hypertrophy. Left ventricular diastolic function could not be evaluated. Right Ventricle: The right ventricular size is mildly enlarged. No increase in right ventricular wall thickness. Global RV systolic function is has normal systolic function. Left Atrium: Left atrial size was normal in size. Right Atrium: Right atrial size was normal in size. Pericardium: There is no evidence of pericardial effusion. Mitral Valve: The mitral valve is degenerative in appearance. Moderate mitral annular calcification. No evidence of mitral valve regurgitation. Tricuspid Valve: The tricuspid valve is not assessed. Tricuspid valve regurgitation is not demonstrated. Aortic Valve: The aortic valve is tricuspid. The aortic valve is tricuspid. Severe aortic stenosis is present. Aortic valve mean gradient measures 3.0 mmHg. Aortic valve peak gradient measures 6.2 mmHg. Aortic valve area, by VTI measures 1.89 cm. There is severe calcifcation of the aortic valve. 26 Medtronic CoreVlave-Evolut Proplus valve is present in the aortic position. Procedure Date: 06/07/2019. Pulmonic Valve: The pulmonic valve was not assessed. Pulmonic valve regurgitation is not visualized. Aorta: The aortic root is normal in size and structure. The aortic root and ascending aorta are structurally normal, with no evidence of dilitation. Shunts: The interatrial septum was not assessed.  LEFT VENTRICLE          Normals PLAX 2D LVOT diam:     1.60 cm  2.0 cm LVOT Area:     2.01 cm 3.14 cm2  AORTIC VALVE                   Normals AV Area (Vmax):    1.77 cm AV Area (Vmean):  1.88 cm    3.06 cm2 AV Area (VTI):     1.89 cm AV Vmax:           124.00 cm/s AV  Vmean:          83.200 cm/s 77 cm/s AV VTI:            0.276 m     3.15 cm2 AV Peak Grad:      6.2 mmHg AV Mean Grad:      3.0 mmHg    3 mmHg LVOT Vmax:         109.00 cm/s LVOT Vmean:        77.900 cm/s 75 cm/s LVOT VTI:          0.259 m     25.3 cm LVOT/AV VTI ratio: 0.94        1  SHUNTS Systemic VTI:  0.26 m Systemic Diam: 1.60 cm  Rachelle Hora Croitoru MD Electronically signed by Thurmon Fair MD Signature Date/Time: 06/07/2019/11:08:42 AMThe mitral valve is degenerative in appearance.    Final (Updated)    Structural Heart Procedure  Result Date: 06/07/2019 See surgical note for result.  CT Angio Abd/Pel w/ and/or w/o  Result Date: 05/11/2019 CLINICAL DATA:  Severe symptomatic aortic stenosis. Pre-TAVR evaluation. EXAM: CT ANGIOGRAPHY CHEST, ABDOMEN AND PELVIS TECHNIQUE: Multidetector CT imaging through the chest, abdomen and pelvis was performed using the standard protocol during bolus administration of intravenous contrast. Multiplanar reconstructed images and MIPs were obtained and reviewed to evaluate the vascular anatomy. CONTRAST:  95mL OMNIPAQUE IOHEXOL 350 MG/ML SOLN COMPARISON:  None. FINDINGS: CTA CHEST FINDINGS Cardiovascular: Borderline mild cardiomegaly. Diffuse thickening and calcification of the aortic valve. No significant pericardial effusion/thickening. Atherosclerotic nonaneurysmal thoracic aorta. Normal caliber pulmonary arteries. No central pulmonary emboli. Mediastinum/Nodes: No discrete thyroid nodules. Unremarkable esophagus. No pathologically enlarged axillary, mediastinal or hilar lymph nodes. Lungs/Pleura: No pneumothorax. No pleural effusion. No acute consolidative airspace disease, lung masses or significant pulmonary nodules. Mosaic attenuation throughout both lungs. Musculoskeletal: No aggressive appearing focal osseous lesions. Moderate T9 vertebral compression fracture, chronic appearing. Moderate thoracic spondylosis. CTA ABDOMEN AND PELVIS FINDINGS Hepatobiliary: Normal  liver with no liver mass. Normal gallbladder with no radiopaque cholelithiasis. No biliary ductal dilatation. Pancreas: Normal, with no mass or duct dilation. Spleen: Normal size. No mass. Adrenals/Urinary Tract: Normal adrenals. No hydronephrosis. Subcentimeter hypodense renal cortical lesion in the anterior lower right kidney, too small to characterize, requiring no follow-up. No additional contour deforming renal lesions. Normal bladder. Stomach/Bowel: Normal non-distended stomach. Normal caliber small bowel with no small bowel wall thickening. Normal appendix. Normal large bowel with no diverticulosis, large bowel wall thickening or pericolonic fat stranding. Vascular/Lymphatic: Atherosclerotic nonaneurysmal abdominal aorta. No pathologically enlarged lymph nodes in the abdomen or pelvis. Reproductive: Grossly normal uterus.  No adnexal mass. Other: No pneumoperitoneum, ascites or focal fluid collection. Musculoskeletal: No aggressive appearing focal osseous lesions. Marked lumbar spondylosis. VASCULAR MEASUREMENTS PERTINENT TO TAVR: AORTA: Minimal Aortic Diameter-12.0 x 10.3 mm Severity of Aortic Calcification-mild RIGHT PELVIS: Right Common Iliac Artery - Minimal Diameter-7.1 x 6.9 mm Tortuosity-severe Calcification-mild Right External Iliac Artery - Minimal Diameter-5.8 x 5.8 mm Tortuosity-mild-to-moderate Calcification-none Right Common Femoral Artery - Minimal Diameter-6.6 x 6.0 mm Tortuosity-mild Calcification-none LEFT PELVIS: Left Common Iliac Artery - Minimal Diameter-6.0 x 6.0 mm Tortuosity-moderate to severe Calcification-none Left External Iliac Artery - Minimal Diameter-5.7 x 5.5 mm Tortuosity-mild Calcification-none Left Common Femoral Artery - Minimal Diameter-6.7 x 6.0 mm Tortuosity-mild Calcification-none Review of the MIP images confirms the  above findings. IMPRESSION: 1. Vascular findings and measurements pertinent to potential TAVR procedure, as detailed. 2. Diffuse thickening and  calcification of the aortic valve, compatible with the reported history of severe symptomatic aortic stenosis. 3. Borderline mild cardiomegaly. 4. Aortic Atherosclerosis (ICD10-I70.0). Electronically Signed   By: Ilona Sorrel M.D.   On: 05/11/2019 13:20   Disposition   Pt is being discharged home today in good condition.  Follow-up Plans & Appointments    Follow-up Information    Eileen Stanford, PA-C. Go on 06/15/2019.   Specialties: Cardiology, Radiology Why: @ 1:30pm. Please arrive at least 10 minutes early.  Contact information: [address] [place] [address] [phone]            Discharge Medications   Allergies as of 06/08/2019      Reactions   5-alpha Reductase Inhibitors       Medication List    STOP taking these medications   ibuprofen 200 MG tablet Commonly known as: ADVIL   metoprolol tartrate 50 MG tablet Commonly known as: LOPRESSOR     TAKE these medications   Aspirin 81 81 MG EC tablet Generic drug: aspirin Take 81 mg by mouth daily.   CALCIUM 1200 PO Take 1 tablet by mouth 2 (two) times daily.   cholecalciferol 25 MCG (1000 UT) tablet Commonly known as: VITAMIN D3 Take 1,000 Units by mouth daily.   cimetidine 200 MG tablet Commonly known as: TAGAMET Take 200 mg by mouth daily.   CINNAMON PO Take 1,000 mg by mouth 2 (two) times daily.   clopidogrel 75 MG tablet Commonly known as: PLAVIX Take 1 tablet (75 mg total) by mouth daily with breakfast. Start taking on: June 09, 2019   CRANBERRY FRUIT PO Take 1 capsule by mouth daily.   CULTURELLE DIGESTIVE HEALTH PO Take 1 capsule by mouth daily.   levocetirizine 5 MG tablet Commonly known as: XYZAL Take 5 mg by mouth every evening.   Magnesium 250 MG Tabs Take 250 mg by mouth daily.   multivitamin-lutein Caps capsule Take 1 capsule by mouth daily.   TURMERIC PO Take 1,000 mg by mouth daily.   Tylenol 8 Hour 650 MG CR tablet Generic drug:  acetaminophen Take 1,300 mg by mouth every 8 (eight) hours as needed for pain.   Vitamin B Complex Tabs Take 1 tablet by mouth daily.   vitamin C 1000 MG tablet Take 1,000 mg by mouth daily.           Outstanding Labs/Studies   None.   Duration of Discharge Encounter   Greater than 30 minutes including physician time.  SignedAngelena Form, PA-C 06/08/2019, 9:59 AM [phone]

## 2019-06-09 ENCOUNTER — Telehealth: Payer: Self-pay | Admitting: Physician Assistant

## 2019-06-09 ENCOUNTER — Other Ambulatory Visit: Payer: Self-pay | Admitting: *Deleted

## 2019-06-09 DIAGNOSIS — I4510 Unspecified right bundle-branch block: Secondary | ICD-10-CM

## 2019-06-09 DIAGNOSIS — I447 Left bundle-branch block, unspecified: Secondary | ICD-10-CM

## 2019-06-09 MED FILL — Magnesium Sulfate Inj 50%: INTRAMUSCULAR | Qty: 10 | Status: AC

## 2019-06-09 MED FILL — Potassium Chloride Inj 2 mEq/ML: INTRAVENOUS | Qty: 40 | Status: AC

## 2019-06-09 MED FILL — Heparin Sodium (Porcine) Inj 1000 Unit/ML: INTRAMUSCULAR | Qty: 30 | Status: AC

## 2019-06-09 NOTE — Telephone Encounter (Signed)
  [facility]   MULTIDISCIPLINARY HEART VALVE TEAM   Patient contacted regarding discharge from [facility] on 12/16   Patient understands to follow up with provider Katie Zyliah Schier on 12/23 at [address] Patient understands discharge instructions? yes Patient understands medications and regimen? yes Patient understands to bring all medications to this visit? yes  Antonae Zbikowski PA-C  [facility]      

## 2019-06-10 NOTE — Progress Notes (Signed)
[facility]   MULTIDISCIPLINARY HEART VALVE CLINIC                                       Cardiology Office Note    Date:  06/15/2019   ID:  Crystal LivingLinda Rose, DOB 1945-07-03, MRN [id number]  PCP:  Marcell Angerlark, Candice P, NP  Cardiologist: Bryan Lemmaavid Harding, MD / Dr. Clifton JamesMcAlhany and Dr. Laneta SimmersBartle (TAVR)  [facility]  History of Present Illness:  Crystal LivingLinda Rose is a 73 y.o. female with a history of IRBBB and severe AS s/p TAVR (06/07/19) who presents to clinic for follow up.   She has a history of moderate AS that [facility]She established care with Dr. Herbie BaltimoreHarding in October of 2020 and reported exertional fatigue and shortness of breath as well as some chest pressure and dizziness. Echocardiogram on 04/29/2019 which showed a calcified aortic valve with restricted leaflet mobility. The mean gradient was 41 mmHg with a peak gradient of 70.6 mmHg. There was mild aortic insufficiency. Left ventricular ejection fraction was normal. She underwent cardiac catheterization on the same day which showed no coronary disease. There were normal right heart pressures.   She was evaluated by the multidisciplinary valve team and underwent successful TAVR with a 26 mmMedtronic CoreValve Evolut Pro + THV via the TF approach on 06/07/19. Post operative echo showed EF 65-70%, normally functioning TAVR with a mean gradient of 7 mm Hg and no PVL. She had intermittent LBBB and RBBB so a zio patch was placed at discharge. She was discharged on aspirin and plavix.   Today she presents to clinic for follow up. No CP or SOB. No LE edema, orthopnea or PND. No dizziness or syncope. No blood in stool or urine. No palpitations. Very shocked that I have cleared her to restart her yard work. Lives with her daughter who has been very strict.     Past Medical History:  Diagnosis Date  . GERD (gastroesophageal reflux disease)   . H/O foot surgery   . Hypertension   . Osteoporosis, post-menopausal   . RBBB    . S/P TAVR (transcatheter aortic valve replacement)   . Severe aortic stenosis 04/06/2019   2D Echo [facility]) 02/16/2019: Severe aortic stenosis.  (Peak velocity 4.1 m/sec, mean gradient 40.0 mmHg, peak gradient 69 mmHg).     Past Surgical History:  Procedure Laterality Date  . CORONARY ANGIOGRAPHY N/A 04/29/2019   Procedure: CORONARY ANGIOGRAPHY;  Surgeon: Marykay LexHarding, David W, MD;  [facility];  Service: Cardiovascular;  Laterality: N/A;  . INTRAOPERATIVE TRANSTHORACIC ECHOCARDIOGRAM N/A 06/07/2019   Procedure: TRANSTHORACIC ECHOCARDIOGRAM (TEE);  Surgeon: Kathleene HazelMcAlhany, Christopher D, MD;  [facility];  Service: Open Heart Surgery;  Laterality: N/A;  . RIGHT HEART CATH N/A 04/29/2019   Procedure: RIGHT HEART CATH;  Surgeon: Marykay LexHarding, David W, MD;  [facility];  Service: Cardiovascular;  Laterality: N/A;  . TRANSCATHETER AORTIC VALVE REPLACEMENT, TRANSFEMORAL  06/07/2019  . TRANSCATHETER AORTIC VALVE REPLACEMENT, TRANSFEMORAL N/A 06/07/2019   Procedure: TRANSCATHETER AORTIC VALVE REPLACEMENT, TRANSFEMORAL;  Surgeon: Kathleene HazelMcAlhany, Christopher D, MD;  Location: [facility] OR;  Service: Open Heart Surgery;  Laterality: N/A;  . TRANSTHORACIC ECHOCARDIOGRAM  02/16/2019   2D Echo [facility]) : Normal LV size and function.  EF 60 to 65%.  Normal RV pressures.  GR 1 DD.  Severe aortic stenosis.  (Peak velocity  4.1 m/sec, mean gradient 40.0 mmHg, peak gradient 69 mmHg).     Current Medications: Outpatient Medications Prior to Visit  Medication Sig Dispense Refill  . acetaminophen (TYLENOL 8 HOUR) 650 MG CR tablet Take 1,300 mg by mouth every 8 (eight) hours as needed for pain.     . Ascorbic Acid (VITAMIN C) 1000 MG tablet Take 1,000 mg by mouth daily.    Marland Kitchen aspirin (ASPIRIN 81) 81 MG EC tablet Take 81 mg by mouth daily.     . B Complex Vitamins (VITAMIN B COMPLEX) TABS Take 1 tablet by mouth daily.     . Calcium Carbonate-Vit D-Min (CALCIUM 1200 PO) Take 1 tablet by mouth 2  (two) times daily.     . cholecalciferol (VITAMIN D3) 25 MCG (1000 UT) tablet Take 1,000 Units by mouth daily.    . cimetidine (TAGAMET) 200 MG tablet Take 200 mg by mouth daily.    Marland Kitchen CINNAMON PO Take 1,000 mg by mouth 2 (two) times daily.     . clopidogrel (PLAVIX) 75 MG tablet Take 1 tablet (75 mg total) by mouth daily with breakfast. 90 tablet 1  . CRANBERRY FRUIT PO Take 1 capsule by mouth daily.     . Lactobacillus-Inulin (CULTURELLE DIGESTIVE HEALTH PO) Take 1 capsule by mouth daily.    Marland Kitchen levocetirizine (XYZAL) 5 MG tablet Take 5 mg by mouth every evening.     . Magnesium 250 MG TABS Take 250 mg by mouth daily.    . multivitamin-lutein (OCUVITE-LUTEIN) CAPS capsule Take 1 capsule by mouth daily.    . TURMERIC PO Take 1,000 mg by mouth daily.     No facility-administered medications prior to visit.     Allergies:   5-alpha reductase inhibitors   Social History   Socioeconomic History  . Marital status: Widowed    Spouse name: Not on file  . Number of children: 1  . Years of education: Not on file  . Highest education level: Associate degree: occupational, Scientist, product/process development, or vocational program  Occupational History  . Occupation: Retired-pharmacy tech/hairdresser  Tobacco Use  . Smoking status: Never Smoker  . Smokeless tobacco: Never Used  Substance and Sexual Activity  . Alcohol use: Not Currently  . Drug use: Never  . Sexual activity: Not on file  Other Topics Concern  . Not on file  Social History Narrative   She is a retired Pharmacologist.  Has 1 daughter that she lives with in [place].      Does not exercise regularly, but does yard work and gardening.   She lives an active lifestyle and tends to have a relatively healthy diet.      Her daughter is a grade school teacher, and Ennifer has been making masks for her daughter to wear at school.   Social Determinants of Health   Financial Resource Strain:   . Difficulty of Paying Rose Expenses: Not on file    Food Insecurity:   . Worried About Programme researcher, broadcasting/film/video in the Last Year: Not on file  . Ran Out of Food in the Last Year: Not on file  Transportation Needs:   . Lack of Transportation (Medical): Not on file  . Lack of Transportation (Non-Medical): Not on file  Physical Activity:   . Days of Exercise per Week: Not on file  . Minutes of Exercise per Session: Not on file  Stress:   . Feeling of Stress : Not on file  Social Connections:   .  Frequency of Communication with Friends and Family: Not on file  . Frequency of Social Gatherings with Friends and Family: Not on file  . Attends Religious Services: Not on file  . Active Member of Clubs or Organizations: Not on file  . Attends Banker Meetings: Not on file  . Marital Status: Not on file     Family History:  The patient's family history includes Alcoholism in her brother; Breast cancer in her mother; Dementia in her sister; Drug abuse in her brother; Heart attack (age of onset: 20) in her mother.     ROS:   Please see the history of present illness.    ROS All other systems reviewed and are negative.   PHYSICAL EXAM:   VS:  BP 130/64 (BP Location: Right Arm, Patient Position: Sitting, Cuff Size: Normal)   Pulse 75   Ht  (1.473 m)   Wt 114 lb 4 oz (51.8 kg)   BMI 23.88 kg/m    GEN: Well nourished, well developed, in no acute distress HEENT: normal Neck: no JVD or masses Cardiac: RRR; 2/6 murmur @ RUSB.  no rubs, or gallops,no edema  Respiratory:  clear to auscultation bilaterally, normal work of breathing GI: soft, nontender, nondistended, + BS MS: no deformity or atrophy Skin: warm and dry, no rash.  Groin sites clear without hematoma or ecchymosis. Bandage removed.  Neuro:  Alert and Oriented x 3, Strength and sensation are intact Psych: euthymic mood, full affect   Wt Readings from Last 3 Encounters:  06/15/19 114 lb 4 oz (51.8 kg)  06/08/19 111 lb 12.8 oz (50.7 kg)  06/03/19 111 lb 3 oz (50.4  kg)      Studies/Labs Reviewed:   EKG:  EKG is ordered today.  The ekg ordered today demonstrates NSR with HR 75, LBBB with QRS 122 ms  Recent Labs: 06/03/2019: ALT 24; B Natriuretic Peptide 125.2 06/08/2019: BUN 15; Creatinine, Ser 0.74; Hemoglobin 10.1; Magnesium 1.8; Platelets 214; Potassium 3.9; Sodium 135   Lipid Panel No results found for: CHOL, TRIG, HDL, CHOLHDL, VLDL, LDLCALC, LDLDIRECT  Additional studies/ records that were reviewed today include:  TAVR OPERATIVE NOTE  Date of Procedure:06/07/2019  Preoperative Diagnosis:Severe Aortic Stenosis   Postoperative Diagnosis:Same   Procedure:   Transcatheter Aortic Valve Replacement - PercutaneousRightTransfemoral Approach Medtronic CoreValve Evolut Pro +(size 26mm, serial # [id number])  Co-Surgeons:Bryan Jennefer Bravo, MD and Verne Carrow, MD   Anesthesiologist:Carswell Jean Rosenthal, MD  Echocardiographer:Mihai Croitoru, MD  Pre-operative Echo Findings: ? Severe aortic stenosis ? Normal left ventricular systolic function  Post-operative Echo Findings: ? Noparavalvular leak ? Normal left ventricular systolic function   _____________    Echo 06/08/19: IMPRESSIONS  1. 26 mm Corevalve in aortic position. Vmax 1.85 m/s, MG 7 mmHG, AVA 1.79 cm2. No paravalvular leak. Normal functioning TAVR valve.  2. Left ventricular ejection fraction, by visual estimation, is 65 to 70%. The left ventricle has normal function. There is no left ventricular hypertrophy.  3. The left ventricle has no regional wall motion abnormalities.  4. Global right ventricle has normal systolic function.The right ventricular size is normal. No increase in right ventricular wall thickness.  5. Left atrial size was normal.  6. Right atrial size was normal.  7. Presence of pericardial fat pad.  8. Trivial pericardial  effusion is present.  9. Moderate mitral annular calcification. 10. The mitral valve is degenerative. Trivial mitral valve regurgitation. Mild mitral stenosis. 11. The tricuspid valve is grossly normal. Tricuspid valve regurgitation is  trivial. 12. Aortic valve regurgitation is not visualized. 13. The pulmonic valve was grossly normal. Pulmonic valve regurgitation is not visualized. 14. Normal pulmonary artery systolic pressure. 15. The inferior vena cava is normal in size with <50% respiratory variability, suggesting right atrial pressure of 8 mmHg. 16. Mild mitral stenosis (mean gradient 5.8 mmHG @ 81 bpm). Related to mitral annular calcification. 17. Changes from prior study are noted.  Aortic Valve: The aortic valve has been repaired/replaced. Aortic valve regurgitation is not visualized. Aortic valve mean gradient measures 7.0 mmHg. Aortic valve peak gradient measures 13.7 mmHg. Aortic valve area, by VTI measures 1.79 cm. 26  Medtronic CoreValve-Evolut Pro bioprosthetic, stented aortic valve (TAVR) valve is present in the aortic position. Procedure Date: 06/07/2019. 26 mm Corevalve in aortic position. Vmax 1.85 m/s, MG 7 mmHG, AVA 1.79 cm2. No paravalvular leak. Normal functioning TAVR valve.  ASSESSMENT & PLAN:   Severe aortic stenosis s/p TAVR: doing well. Groin sites clear. ECG with no HAVB. SBE prophylaxis discussed; I have RX'd amoxicillin. Continue aspirin and plavix. I will see her in January for 1 month follow up and echo.   IRBBB: ECG today shows LBBB with QRS 153ms. No dizziness or syncope. Wearing Zio patch AT which has had no alerts.  Covid 19 education: we spent a long time taking about the pandemic and the vaccine.   Medication Adjustments/Labs and Tests Ordered: Current medicines are reviewed at length with the patient today.  Concerns regarding medicines are outlined above.  Medication changes, Labs and Tests ordered today are listed in the Patient Instructions  below. Patient Instructions  Medication Instructions:  Your physician has recommended you make the following change in your medication:  TAKE Amoxicillin 2000 mg 1 hour before dental procedures  *If you need a refill on your cardiac medications before your next appointment, please call your pharmacy*  Lab Work: None Ordered   Testing/Procedures: Keep your appointment for echo on 1/13   Follow-Up: Your physician recommends that you keep your follow-up appointment on January 13 with Nell Range, PA     Signed, Angelena Form, PA-C  06/15/2019 2:07 PM    [address], [place], [place]  [address] Phone: [phone]; Fax: [phone]

## 2019-06-15 ENCOUNTER — Ambulatory Visit: Payer: Medicare Other | Admitting: Physician Assistant

## 2019-06-15 ENCOUNTER — Other Ambulatory Visit: Payer: Self-pay

## 2019-06-15 VITALS — BP 130/64 | HR 75 | Ht <= 58 in | Wt 114.2 lb

## 2019-06-15 DIAGNOSIS — Z952 Presence of prosthetic heart valve: Secondary | ICD-10-CM | POA: Diagnosis not present

## 2019-06-15 DIAGNOSIS — Z7189 Other specified counseling: Secondary | ICD-10-CM | POA: Diagnosis not present

## 2019-06-15 DIAGNOSIS — I4510 Unspecified right bundle-branch block: Secondary | ICD-10-CM

## 2019-06-15 MED ORDER — AMOXICILLIN 500 MG PO TABS: ORAL_TABLET | ORAL | 3 refills | Status: AC

## 2019-06-15 NOTE — Patient Instructions (Signed)
Medication Instructions:  Your physician has recommended you make the following change in your medication:  TAKE Amoxicillin 2000 mg 1 hour before dental procedures  *If you need a refill on your cardiac medications before your next appointment, please call your pharmacy*  Lab Work: None Ordered   Testing/Procedures: Keep your appointment for echo on 1/13   Follow-Up: Your physician recommends that you keep your follow-up appointment on January 13 with Nell Range, [place]

## 2019-06-15 NOTE — Addendum Note (Signed)
Addended by: Emmaline Life on: 06/15/2019 03:24 PM   Modules accepted: Orders

## 2019-07-04 NOTE — Progress Notes (Signed)
[facility]   MULTIDISCIPLINARY HEART VALVE CLINIC                                       Cardiology Office Note    Date:  07/06/2019   ID:  Crystal Rose, DOB 01-03-1946, MRN [id number]  PCP:  Crystal Anger, NP  Cardiologist: Crystal Lemma, MD / Dr. Clifton Rose and Dr. Laneta Rose (TAVR)  CC: 1 month s/p TAVR  History of Present Illness:  Crystal Rose is a 74 y.o. female with a history of IRBBB and severe AS s/p TAVR (06/07/19) who presents to clinic for follow up.   She has a history of moderate AS that was previously followed at [facility].She established care with Dr. Herbie Rose in October of 2020 and reported exertional fatigue and shortness of breath as well as some chest pressure and dizziness. Echocardiogram on 04/29/2019 which showed a calcified aortic valve with restricted leaflet mobility. The mean gradient was 41 mmHg with a peak gradient of 70.6 mmHg. There was mild aortic insufficiency. Left ventricular ejection fraction was normal. She underwent cardiac catheterization on the same day which showed no coronary disease. There were normal right heart pressures.   She was evaluated by the multidisciplinary valve team and underwent successful TAVR with a 26 mmMedtronic CoreValve Evolut Pro + THV via the TF approach on 06/07/19. Post operative echo showed EF 65-70%, normally functioning TAVR with a mean gradient of 7 mm Hg and no PVL. She had intermittent LBBB and RBBB so a zio patch was placed at discharge. She was discharged on aspirin and plavix.   Today she presents to clinic for follow up. No CP or SOB. No LE edema, orthopnea or PND. No dizziness or syncope. No blood in stool or urine. No palpitations.    Past Medical History:  Diagnosis Date  . GERD (gastroesophageal reflux disease)   . H/O foot surgery   . Hypertension   . Osteoporosis, post-menopausal   . RBBB   . S/P TAVR (transcatheter aortic valve replacement)   . Severe aortic stenosis 04/06/2019   2D Echo [facility]) 02/16/2019: Severe aortic stenosis.  (Peak velocity 4.1 m/sec, mean gradient 40.0 mmHg, peak gradient 69 mmHg).     Past Surgical History:  Procedure Laterality Date  . CORONARY ANGIOGRAPHY N/A 04/29/2019   Procedure: CORONARY ANGIOGRAPHY;  Surgeon: Crystal Lex, MD;  Location: [facility] INVASIVE CV LAB;  Service: Cardiovascular;  Laterality: N/A;  . INTRAOPERATIVE TRANSTHORACIC ECHOCARDIOGRAM N/A 06/07/2019   Procedure: TRANSTHORACIC ECHOCARDIOGRAM (TEE);  Surgeon: Crystal Hazel, MD;  Location: [facility] OR;  Service: Open Heart Surgery;  Laterality: N/A;  . RIGHT HEART CATH N/A 04/29/2019   Procedure: RIGHT HEART CATH;  Surgeon: Crystal Lex, MD;  Location: [facility] INVASIVE CV LAB;  Service: Cardiovascular;  Laterality: N/A;  . TRANSCATHETER AORTIC VALVE REPLACEMENT, TRANSFEMORAL  06/07/2019  . TRANSCATHETER AORTIC VALVE REPLACEMENT, TRANSFEMORAL N/A 06/07/2019   Procedure: TRANSCATHETER AORTIC VALVE REPLACEMENT, TRANSFEMORAL;  Surgeon: Crystal Hazel, MD;  Location: [facility] OR;  Service: Open Heart Surgery;  Laterality: N/A;  . TRANSTHORACIC ECHOCARDIOGRAM  02/16/2019   2D Echo [facility]) : Normal LV size and function.  EF 60 to 65%.  Normal RV pressures.  GR 1 DD.  Severe aortic stenosis.  (Peak velocity 4.1 m/sec, mean gradient 40.0 mmHg, peak gradient 69 mmHg).     Current Medications: Outpatient Medications Prior to Visit  Medication Sig Dispense Refill  . acetaminophen (TYLENOL 8 HOUR) 650 MG CR tablet Take 1,300 mg by mouth every 8 (eight) hours as needed for pain.     Marland Kitchen amoxicillin (AMOXIL) 500 MG tablet Take 4 pills (2000 mg) 1 hour before your dental cleanings/procedures 4 tablet 3  . Ascorbic Acid (VITAMIN C) 1000 MG tablet Take 1,000 mg by mouth daily.    Marland Kitchen aspirin (ASPIRIN 81) 81 MG EC tablet Take 81 mg by mouth daily.     . B Complex Vitamins (VITAMIN B COMPLEX) TABS Take 1 tablet by mouth daily.     . Calcium Carbonate-Vit D-Min (CALCIUM 1200 PO) Take 1 tablet by  mouth 2 (two) times daily.     . cholecalciferol (VITAMIN D3) 25 MCG (1000 UT) tablet Take 1,000 Units by mouth daily.    . cimetidine (TAGAMET) 200 MG tablet Take 200 mg by mouth daily.    Marland Kitchen CINNAMON PO Take 1,000 mg by mouth 2 (two) times daily.     . clopidogrel (PLAVIX) 75 MG tablet Take 1 tablet (75 mg total) by mouth daily with breakfast. 90 tablet 1  . CRANBERRY FRUIT PO Take 1 capsule by mouth daily.     . Lactobacillus-Inulin (CULTURELLE DIGESTIVE HEALTH PO) Take 1 capsule by mouth daily.    Marland Kitchen levocetirizine (XYZAL) 5 MG tablet Take 5 mg by mouth every evening.     . Magnesium 250 MG TABS Take 250 mg by mouth daily.    . multivitamin-lutein (OCUVITE-LUTEIN) CAPS capsule Take 1 capsule by mouth daily.    . TURMERIC PO Take 1,000 mg by mouth daily.     No facility-administered medications prior to visit.     Allergies:   5-alpha reductase inhibitors   Social History   Socioeconomic History  . Marital status: Widowed    Spouse name: Not on file  . Number of children: 1  . Years of education: Not on file  . Highest education level: Associate degree: occupational, Hotel manager, or vocational program  Occupational History  . Occupation: Retired-pharmacy tech/hairdresser  Tobacco Use  . Smoking status: Never Smoker  . Smokeless tobacco: Never Used  Substance and Sexual Activity  . Alcohol use: Not Currently  . Drug use: Never  . Sexual activity: Not on file  Other Topics Concern  . Not on file  Social History Narrative   She is a retired Education administrator.  Has 1 daughter that she lives with in [place].      Does not exercise regularly, but does yard work and gardening.   She lives an active lifestyle and tends to have a relatively healthy diet.      Her daughter is a grade school teacher, and Tayllor has been making masks for her daughter to wear at school.   Social Determinants of Health   Financial Resource Strain:   . Difficulty of Paying Rose Expenses: Not on  file  Food Insecurity:   . Worried About Charity fundraiser in the Last Year: Not on file  . Ran Out of Food in the Last Year: Not on file  Transportation Needs:   . Lack of Transportation (Medical): Not on file  . Lack of Transportation (Non-Medical): Not on file  Physical Activity:   . Days of Exercise per Week: Not on file  . Minutes of Exercise per Session: Not on file  Stress:   . Feeling of Stress : Not on file  Social Connections:   . Frequency of  Communication with Friends and Family: Not on file  . Frequency of Social Gatherings with Friends and Family: Not on file  . Attends Religious Services: Not on file  . Active Member of Clubs or Organizations: Not on file  . Attends Banker Meetings: Not on file  . Marital Status: Not on file     Family History:  The patient's family history includes Alcoholism in her brother; Breast cancer in her mother; Dementia in her sister; Drug abuse in her brother; Heart attack (age of onset: 59) in her mother.     ROS:   Please see the history of present illness.    ROS All other systems reviewed and are negative.   PHYSICAL EXAM:   VS:  BP (!) 148/74   Pulse 78   Ht 4\' 10"  (1.473 m)   Wt 116 lb (52.6 kg)   SpO2 99%   BMI 24.24 kg/m    GEN: Well nourished, well developed, in no acute distress HEENT: normal Neck: no JVD or masses Cardiac: RRR; 2/6 murmur @ RUSB. No rubs, or gallops,no edema  Respiratory:  clear to auscultation bilaterally, normal work of breathing GI: soft, nontender, nondistended, + BS MS: no deformity or atrophy Skin: warm and dry, no rash. Neuro:  Alert and Oriented x 3, Strength and sensation are intact Psych: euthymic mood, full affect   Wt Readings from Last 3 Encounters:  07/06/19 116 lb (52.6 kg)  06/15/19 114 lb 4 oz (51.8 kg)  06/08/19 111 lb 12.8 oz (50.7 kg)      Studies/Labs Reviewed:   EKG:  EKG is NOT ordered today.    Recent Labs: 06/03/2019: ALT 24; B Natriuretic Peptide  125.2 06/08/2019: BUN 15; Creatinine, Ser 0.74; Hemoglobin 10.1; Magnesium 1.8; Platelets 214; Potassium 3.9; Sodium 135   Lipid Panel No results found for: CHOL, TRIG, HDL, CHOLHDL, VLDL, LDLCALC, LDLDIRECT  Additional studies/ records that were reviewed today include:  TAVR OPERATIVE NOTE  Date of Procedure:06/07/2019  Preoperative Diagnosis:Severe Aortic Stenosis   Postoperative Diagnosis:Same   Procedure:   Transcatheter Aortic Valve Replacement - PercutaneousRightTransfemoral Approach Medtronic CoreValve Evolut Pro +(size 102mm, serial # [id number])  Co-Surgeons:Crystal [id number], MD and Jennefer Bravo, MD   Anesthesiologist:Carswell Verne Carrow, MD  Echocardiographer:Mihai Croitoru, MD  Pre-operative Echo Findings: ? Severe aortic stenosis ? Normal left ventricular systolic function  Post-operative Echo Findings: ? Noparavalvular leak ? Normal left ventricular systolic function   _____________    Echo 06/08/19: IMPRESSIONS  1. 26 mm Corevalve in aortic position. Vmax 1.85 m/s, MG 7 mmHG, AVA 1.79 cm2. No paravalvular leak. Normal functioning TAVR valve.  2. Left ventricular ejection fraction, by visual estimation, is 65 to 70%. The left ventricle has normal function. There is no left ventricular hypertrophy.  3. The left ventricle has no regional wall motion abnormalities.  4. Global right ventricle has normal systolic function.The right ventricular size is normal. No increase in right ventricular wall thickness.  5. Left atrial size was normal.  6. Right atrial size was normal.  7. Presence of pericardial fat pad.  8. Trivial pericardial effusion is present.  9. Moderate mitral annular calcification. 10. The mitral valve is degenerative. Trivial mitral valve regurgitation. Mild mitral stenosis. 11. The tricuspid valve is grossly  normal. Tricuspid valve regurgitation is trivial. 12. Aortic valve regurgitation is not visualized. 13. The pulmonic valve was grossly normal. Pulmonic valve regurgitation is not visualized. 14. Normal pulmonary artery systolic pressure. 15. The inferior vena cava is normal in  size with <50% respiratory variability, suggesting right atrial pressure of 8 mmHg. 16. Mild mitral stenosis (mean gradient 5.8 mmHG @ 81 bpm). Related to mitral annular calcification. 17. Changes from prior study are noted.  Aortic Valve: The aortic valve has been repaired/replaced. Aortic valve regurgitation is not visualized. Aortic valve mean gradient measures 7.0 mmHg. Aortic valve peak gradient measures 13.7 mmHg. Aortic valve area, by VTI measures 1.79 cm. 26  Medtronic CoreValve-Evolut Pro bioprosthetic, stented aortic valve (TAVR) valve is present in the aortic position. Procedure Date: 06/07/2019. 26 mm Corevalve in aortic position. Vmax 1.85 m/s, MG 7 mmHG, AVA 1.79 cm2. No paravalvular leak. Normal functioning TAVR valve.   _______________   Echo 07/06/19 IMPRESSIONS  1. Left ventricular ejection fraction, by visual estimation, is 60 to 65%. The left ventricle has normal function. There is no left ventricular hypertrophy.  2. Elevated left atrial pressure.  3. Left ventricular diastolic parameters are consistent with Grade II diastolic dysfunction (pseudonormalization).  4. The left ventricle has no regional wall motion abnormalities.  5. Global right ventricle has normal systolic function.The right ventricular size is normal. No increase in right ventricular wall thickness.  6. Left atrial size was normal.  7. Right atrial size was normal.  8. Mild mitral annular calcification.  9. The mitral valve is normal in structure. Mild mitral valve regurgitation. No evidence of mitral stenosis. 10. The tricuspid valve is normal in structure. 11. Aortic valve regurgitation is not visualized. No evidence of aortic  valve sclerosis or stenosis. 12. The pulmonic valve was normal in structure. Pulmonic valve regurgitation is trivial. 13. Mildly elevated pulmonary artery systolic pressure. 14. The tricuspid regurgitant velocity is 2.80 m/s, and with an assumed right atrial pressure of 3 mmHg, the estimated right ventricular systolic pressure is mildly elevated at 34.4 mmHg. 15. The inferior vena cava is normal in size with greater than 50% respiratory variability, suggesting right atrial pressure of 3 mmHg. 16. MV mean gradient, 8 mmHg at 75 bpm. 17. Prior images reviewed side by side. 18. Compared to 06/08/2019, the mitral valve gradients are higher, probably due to an increase in cardiac output (pressure half time is unchanged). Otherwise no changes are seen.   Aortic Valve: The aortic valve has been repaired/replaced. Aortic valve regurgitation is not visualized. The aortic valve is structurally normal, with no evidence of sclerosis or stenosis. Aortic valve mean gradient measures 9.0 mmHg. Aortic valve peak gradient measures 13.0 mmHg. Aortic valve area, by VTI measures 1.56 cm. 26 CoreValve-Evolut Pro bioprosthetic, stented aortic valve (TAVR) valve is present in the aortic position. Procedure Date: 06/07/19.  ASSESSMENT & PLAN:   Severe AS s/p TAVR: echo today shows EF 65%, normally functioning TAVR with mean gradient of 13 mm Hg and no PVL. She has NYHA class I symptoms. SBE prophylaxis discussed; she has amoxicillin. Plavix can be discontinued after 6 months of therapy. I will see her back in 1 year for follow up with echo.   IRBBB: Zio patch sent back to company. No s/s HAVB.   HTN: BP elevated today. I have asked her to keep a log at home and call me next week. She may need to be started on an antihypertensive.    Medication Adjustments/Labs and Tests Ordered: Current medicines are reviewed at length with the patient today.  Concerns regarding medicines are outlined above.  Medication changes, Labs  and Tests ordered today are listed in the Patient Instructions below. Patient Instructions  Medication Instructions:  1) You may  STOP PLAVIX in June 2021 when your pills run out  Follow-Up: Your provider recommends that you schedule a follow-up appointment in: 4 months with Dr. Herbie Rose.  Any Other Special Instructions Will Be Listed Below (If Applicable). Please keep a blood pressure log. Call our office [phone]) with some readings in a week or two.    Signed, Cline Crock, PA-C  07/06/2019 4:50 PM    [facility] [address] [place], [place], [place]  [address] Phone: [phone]; Fax: [phone]

## 2019-07-06 ENCOUNTER — Ambulatory Visit (HOSPITAL_COMMUNITY): Payer: Medicare Other | Attending: Cardiology

## 2019-07-06 ENCOUNTER — Other Ambulatory Visit: Payer: Self-pay

## 2019-07-06 ENCOUNTER — Encounter: Payer: Self-pay | Admitting: Physician Assistant

## 2019-07-06 ENCOUNTER — Ambulatory Visit: Payer: Medicare Other | Admitting: Physician Assistant

## 2019-07-06 VITALS — BP 148/74 | HR 78 | Ht <= 58 in | Wt 116.0 lb

## 2019-07-06 DIAGNOSIS — Z952 Presence of prosthetic heart valve: Secondary | ICD-10-CM

## 2019-07-06 DIAGNOSIS — I4510 Unspecified right bundle-branch block: Secondary | ICD-10-CM | POA: Diagnosis not present

## 2019-07-06 NOTE — Patient Instructions (Signed)
Medication Instructions:  1) You may STOP PLAVIX in June 2021 when your pills run out  Follow-Up: Your provider recommends that you schedule a follow-up appointment in: 4 months with Dr. Herbie [place].  Any Other Special Instructions Will Be Listed Below (If Applicable). Please keep a blood pressure log. Call our office [phone]) with some readings in a week or two.

## 2019-07-13 ENCOUNTER — Other Ambulatory Visit: Payer: Self-pay | Admitting: Family

## 2019-07-13 DIAGNOSIS — Z1231 Encounter for screening mammogram for malignant neoplasm of breast: Secondary | ICD-10-CM

## 2019-07-14 ENCOUNTER — Telehealth: Payer: Self-pay

## 2019-07-14 NOTE — Telephone Encounter (Signed)
I called pt and left a message that BP is in good range and we do not need to make any medication adjustments.

## 2019-07-14 NOTE — Telephone Encounter (Signed)
I spoke with the pt and confirmed that she received message from [place].

## 2019-07-14 NOTE — Telephone Encounter (Signed)
  [facility]   MULTIDISCIPLINARY HEART VALVE TEAM  The pt called into the office to make Katie aware of her BP and pulse readings for the past week. At the pt's 1/13 OV her BP was elevated and the pt remembered that she took a Sudafed due to a head cold before coming to her appointment with Florentina Addison.  1/14 128/76, 70 1/15 131/53, 76 1/16 137/58, 71 1/17 129/60, 78 1/18 133/59, 77 1/19 135/60, 71 1/20 131/65, 72  I will forward this information to [facility] for further review.

## 2019-08-17 ENCOUNTER — Other Ambulatory Visit: Payer: Self-pay

## 2019-08-17 ENCOUNTER — Ambulatory Visit
Admission: RE | Admit: 2019-08-17 | Discharge: 2019-08-17 | Disposition: A | Discharge status: Home / Self Care | Payer: Medicare Other | Source: Ambulatory Visit | Attending: Family | Admitting: Family

## 2019-08-17 DIAGNOSIS — Z1231 Encounter for screening mammogram for malignant neoplasm of breast: Secondary | ICD-10-CM

## 2019-10-12 ENCOUNTER — Ambulatory Visit (INDEPENDENT_AMBULATORY_CARE_PROVIDER_SITE_OTHER): Payer: Medicare Other | Admitting: Cardiology

## 2019-10-12 ENCOUNTER — Other Ambulatory Visit: Payer: Self-pay

## 2019-10-12 ENCOUNTER — Encounter: Payer: Self-pay | Admitting: Cardiology

## 2019-10-12 DIAGNOSIS — I10 Essential (primary) hypertension: Secondary | ICD-10-CM

## 2019-10-12 DIAGNOSIS — Z952 Presence of prosthetic heart valve: Secondary | ICD-10-CM

## 2019-10-12 DIAGNOSIS — I350 Nonrheumatic aortic (valve) stenosis: Secondary | ICD-10-CM

## 2019-10-12 NOTE — Progress Notes (Signed)
Primary Care Provider: [name], NP Cardiologist: [name], MD Electrophysiologist: None  TAVR Team: Dr. Angelena Form and Dr. Cyndia Bent  Clinic Note: Chief Complaint  Patient presents with  . Follow-up    First follow-up visit with primary cardiologist since TAVR  . Hypertension    Blood pressures at home seem much better than here.    HPI:    Crystal Rose is a 74 y.o. female initially referred in October 2020 to establish care with an echocardiogram performed at the request PCP indicating calcific restricted aortic valve with mean gradient of 41 millimercury and peak of 70.6.  She noted some exertional dyspnea but as well as some chest tightness.  She was referred to TAVR clinic and underwent TAVR on 06/07/2019.  She has been seen twice for post TAVR follow-up, and now presents today for her first primary cardiology follow-up since TAVR.Marland Kitchen  Crystal Rose was last by Angelena Form, PA on July 06, 2019 for her second follow-up after an echocardiogram.  She had had some intermittent left and right bundle branch block noted postop and therefore had a Zio patch event monitor placed to evaluate for any potential heart block.  Recent Hospitalizations: None since TAVR  Reviewed  CV studies:    The following studies were reviewed today: (if available, images/films reviewed: From Epic Chart or Care Everywhere) . 06/07/2019-TAVR: 26 mm CoreValve (Evolutm Pro) placed via femoral access. . 06/08/2019-Postop Echo: AVA increased to 1.79 cm.  No paravalvular leak.  Valve well-positioned.  (Mean gradient 7 mmHg, peak ~14 mmHg) EF 65-70%.  No LVH.  Normal atrial sizes.  Moderate MAC. . 07/06/2019-Echo: EF 60 to 65%.  Suggested grade 2 diastolic function?  But with normal atrial size (unlikely grade 2 diastolic dysfunction).  AoV Mean gradient 9 mmHg, peak 13 mmHg. Elwyn Reach patch monitor (December 2020 post TAVR): Heart rate ranged from 47 to 120 bpm with average 72 bpm.   Bradycardia noted in hours of sleep.  Rare PACs and PVCs.  17 short bursts from 4-15 beats SVT/PAT.  No pauses or heart block.   Interval History:   Crystal Rose returns here today for follow-up noting that she is a little bit out of shape and negative short of breath when she tries to walk.  But what she mostly notes is getting short of breath if she walks uphill or feet she is carrying something up steps.  She is up and going from 5 AM to 9 PM and never has chance to stop.  She brought with her blood pressure recordings that indicate with exception of one checked being 154/65, all the remainder were in the 120s over 60s to 70s.  The current blood pressure today seems to be not consistent with what she is getting at home with her reads.  She has been quite stable overall from a cardiac standpoint.  She really cannot tell me much difference between her level of dyspnea pre and post TAVR, but has not necessarily thought about that much.  The referral for TAVR happened so fast that she really did not have a chance to think about things.  CV Review of Symptoms (Summary) Cardiovascular ROS: positive for - Pretty much stable mild exertional dyspnea but not with routine activity or even notable exertion. negative for - chest pain, edema, irregular heartbeat, orthopnea, palpitations, paroxysmal nocturnal dyspnea, rapid heart rate, shortness of breath or Syncope/near syncope, TIA/amaurosis fugax, claudication  The patient does not have symptoms concerning for COVID-19  infection (fever, chills, cough, or new shortness of breath).  The patient is practicing social distancing & Masking.   She had her second COVID-19 vaccine injection last week  REVIEWED OF SYSTEMS   Review of Systems  Constitutional: Negative for malaise/fatigue and weight loss.  HENT: Negative for nosebleeds.   Respiratory: Negative for shortness of breath.   Gastrointestinal: Negative for abdominal pain, blood in stool and  melena.  Genitourinary: Negative for hematuria.  Musculoskeletal: Negative for falls and joint pain.  Neurological: Negative for dizziness and headaches.  Psychiatric/Behavioral: Negative for memory loss. The patient is not nervous/anxious and does not have insomnia.    I have reviewed and (if needed) personally updated the patient's problem list, medications, allergies, past medical and surgical history, social and family history.   PAST MEDICAL HISTORY   Past Medical History:  Diagnosis Date  . GERD (gastroesophageal reflux disease)   . H/O foot surgery   . Hypertension   . Osteoporosis, post-menopausal   . RBBB   . S/P TAVR (transcatheter aortic valve replacement)   . Severe aortic stenosis 04/06/2019   2D Echo [facility]) 02/16/2019: Severe aortic stenosis.  (Peak velocity 4.1 m/sec, mean gradient 40.0 mmHg, peak gradient 69 mmHg).     PAST SURGICAL HISTORY   Past Surgical History:  Procedure Laterality Date  . CORONARY ANGIOGRAPHY N/A 04/29/2019   Procedure: CORONARY ANGIOGRAPHY;  Surgeon: [name], MD;  Location: [place] [facility];  Service: Cardiovascular;  Laterality: N/A;  . INTRAOPERATIVE TRANSTHORACIC ECHOCARDIOGRAM N/A 06/07/2019   Procedure: TRANSTHORACIC ECHOCARDIOGRAM (TEE);  Surgeon: Burnell Blanks, MD;  Location: [place];  Service: Open Heart Surgery;  Laterality: N/A;  . RIGHT HEART CATH N/A 04/29/2019   Procedure: RIGHT HEART CATH;  Surgeon: [name], MD;  Location: [place] [facility];  Service: Cardiovascular;  Laterality: N/A;  . TRANSCATHETER AORTIC VALVE REPLACEMENT, TRANSFEMORAL N/A 06/07/2019   Procedure: TRANSCATHETER AORTIC VALVE REPLACEMENT, TRANSFEMORAL;  Surgeon: Burnell Blanks, MD;  Location: [facility];  Service: Open Heart Surgery;  RFA Access - 26- CoreValve Evolut Pro TAVR valve  . TRANSTHORACIC ECHOCARDIOGRAM  02/16/2019   2D Echo [facility]) : Normal LV size and function.  EF 60 to 65%.  Normal RV pressures.  GR 1 DD.   Severe aortic stenosis.  (Peak velocity 4.1 m/sec, mean gradient 40.0 mmHg, peak gradient 69 mmHg).   . TRANSTHORACIC ECHOCARDIOGRAM  12/15/'20; 1/13/'21   Day 1Postop Echo: AVA increased to 1.79 cm.  No paravalvular leak.  Valve well-positioned.  EF 65-70%.  No LVH.  Normal atrial sizes.  Moderate MAC.--> F/u Echo 07/06/19: EF 60 to 65%.  Suggested grade 2 diastolic function?  But with normal atrial size (unlikely grade 2 diastolic dysfunction).  AoV Mean gradient 9 mmHg, peak 13 mmHg.    MEDICATIONS/ALLERGIES   Current Meds  Medication Sig  . acetaminophen (TYLENOL 8 HOUR) 650 MG CR tablet Take 1,300 mg by mouth every 8 (eight) hours as needed for pain.   Marland Kitchen amoxicillin (AMOXIL) 500 MG tablet Take 4 pills (2000 mg) 1 hour before your dental cleanings/procedures  . Ascorbic Acid (VITAMIN C) 1000 MG tablet Take 1,000 mg by mouth daily.  Marland Kitchen aspirin (ASPIRIN 81) 81 MG EC tablet Take 81 mg by mouth daily.   . B Complex Vitamins (VITAMIN B COMPLEX) TABS Take 1 tablet by mouth daily.   . Calcium Carbonate-Vit D-Min (CALCIUM 1200 PO) Take 1 tablet by mouth 2 (two) times daily.   Marland Kitchen  cholecalciferol (VITAMIN D3) 25 MCG (1000 UT) tablet Take 1,000 Units by mouth daily.  . cimetidine (TAGAMET) 200 MG tablet Take 200 mg by mouth daily.  Marland Kitchen CINNAMON PO Take 1,000 mg by mouth 2 (two) times daily.   . clopidogrel (PLAVIX) 75 MG tablet Take 1 tablet (75 mg total) by mouth daily with breakfast.  . Lactobacillus-Inulin (CULTURELLE DIGESTIVE HEALTH PO) Take 1 capsule by mouth daily.  Marland Kitchen levocetirizine (XYZAL) 5 MG tablet Take 5 mg by mouth every evening.   . Magnesium 250 MG TABS Take 250 mg by mouth daily.  . multivitamin-lutein (OCUVITE-LUTEIN) CAPS capsule Take 1 capsule by mouth daily.  . [DISCONTINUED] CRANBERRY FRUIT PO Take 1 capsule by mouth daily.   . [DISCONTINUED] TURMERIC PO Take 1,000 mg by mouth daily.    Allergies  Allergen Reactions  . 5-Alpha Reductase Inhibitors     SOCIAL HISTORY/FAMILY  HISTORY   Reviewed in Epic:  Pertinent findings: No new changes  OBJCTIVE -PE, EKG, labs   Wt Readings from Last 3 Encounters:  10/12/19 116 lb 3.2 oz (52.7 kg)  07/06/19 116 lb (52.6 kg)  06/15/19 114 lb 4 oz (51.8 kg)    Physical Exam: BP (!) 149/72   Pulse 72   Temp 97.8 F (36.6 C)   Resp 20   Ht _0  (1.473 m)   Wt 116 lb 3.2 oz (52.7 kg)   SpO2 100%   BMI 24.29 kg/m  Physical Exam  Constitutional: She is oriented to person, place, and time. She appears well-developed and well-nourished. No distress.  Healthy-appearing elderly woman who appears younger than stated age.  Well-groomed.  HENT:  Head: Normocephalic and atraumatic.  Neck: No hepatojugular reflux and no JVD present. Carotid bruit is not present (Radiating aortic valve murmur).  Cardiovascular: Normal rate, regular rhythm and intact distal pulses.  No extrasystoles are present. PMI is not displaced. Exam reveals no gallop and no friction rub.  Murmur (2/6 SEM at RUSB.--Carotid) heard. Pulmonary/Chest: Breath sounds normal. No respiratory distress. She has no wheezes. She has no rales.  Abdominal: Soft. Bowel sounds are normal. She exhibits no distension. There is no abdominal tenderness. There is no rebound.  No HSM  Musculoskeletal:        General: No edema. Normal range of motion.     Cervical back: Normal range of motion and neck supple.  Neurological: She is alert and oriented to person, place, and time.  Psychiatric: She has a normal mood and affect. Her behavior is normal. Judgment and thought content normal.  Vitals reviewed.   Adult ECG Report Not checked  Recent Labs:   Unfortunately do not have lipids available. No results found for: CHOL, HDL, LDLCALC, LDLDIRECT, TRIG, CHOLHDL Lab Results  Component Value Date   CREATININE 0.74 06/08/2019   BUN 15 06/08/2019   NA 135 06/08/2019   K 3.9 06/08/2019   CL 104 06/08/2019   CO2 23 06/08/2019   No results found for: TSH  ASSESSMENT/PLAN      Problem List Items Addressed This Visit    Hypertension (Chronic)    Her home recordings were actually indicate that the blood pressure recorded today is actually mostly whitecoat hypertension.  The fact that her pressures at home are well controlled would not mean that I would be reluctant to actually treat her current pressures.  Continue to monitor and we can discuss in follow-up. Discussed importance of avoiding salty foods and other foods of concern.  We will reevaluate via  in person or telehealth medicine in roughly 2 to 3 months to reassess her blood pressure.      Severe aortic stenosis    Borderline symptomatic severe AS, referred for TAVR.  Had relatively rapid progression of leading to desire to treat early.      S/P TAVR (transcatheter aortic valve replacement)    Now status post 26 mm CoreValve TAVR placement. Murmur has notably reduced, but suspect that there may still be some mild size related turbulence of flow.  Her last follow-up echo was outstanding.  NYHA Class I symptoms with no minimal exertional dyspnea and no other concerning symptoms.        COVID-19 Education: The signs and symptoms of COVID-19 were discussed with the patient and how to seek care for testing (follow up with PCP or arrange E-visit).   The importance of social distancing and COVID-19 vaccination was discussed today.  I spent a total of 18 minutes with the patient. >  50% of the time was spent in direct patient consultation.  Additional time spent with chart review  / charting (studies, outside notes, etc): 6 Total Time: 24 min   Current medicines are reviewed at length with the patient today.  (+/- concerns) none  Notice: This dictation was prepared with Dragon dictation along with smaller phrase technology. Any transcriptional errors that result from this process are unintentional and may not be corrected upon review.  Patient Instructions / Medication Changes & Studies & Tests Ordered    Patient Instructions  Medication Instructions:  The current medical regimen is effective;  continue present plan and medications as directed. Please refer to the Current Medication list given to you today.  *If you need a refill on your cardiac medications before your next appointment, please call your pharmacy*  Special Instructions PLEASE READ AND FOLLOW SALTY 6-ATTACHED  Follow-Up: Your next appointment:  MAY-JUNE 2022 Please call our office 2 months in advance to schedule this appointment In Person with You may see [name], MD or one of the following Advanced Practice Providers on your designated Care Team:  Rosaria Ferries, PA-C  Jory Sims, DNP, ANP Cadence Kathlen Mody, NP  At [facility], you and your health needs are our priority.  As part of our continuing mission to provide you with exceptional heart care, we have created designated Provider Care Teams.  These Care Teams include your primary Cardiologist (physician) and Advanced Practice Providers (APPs -  Physician Assistants and Nurse Practitioners) who all work together to provide you with the care you need, when you need it.            Studies Ordered:   No orders of the defined types were placed in this encounter.    [name], M.D., M.S. Interventional Cardiologist   Pager # [phone] Phone # [phone] [address]. [place], [place] [address]   Thank you for choosing Heartcare at [facility]!!

## 2019-10-12 NOTE — Patient Instructions (Signed)
Medication Instructions:  The current medical regimen is effective;  continue present plan and medications as directed. Please refer to the Current Medication list given to you today.  *If you need a refill on your cardiac medications before your next appointment, please call your pharmacy*  Special Instructions PLEASE READ AND FOLLOW SALTY 6-ATTACHED  Follow-Up: Your next appointment:  MAY-JUNE 2022 Please call our office 2 months in advance to schedule this appointment In Person with You may see Bryan Lemma, MD or one of the following Advanced Practice Providers on your designated Care Team:  Theodore Demark, PA-C  Joni Reining, DNP, ANP Cadence Fransico Michael, NP  At [facility], you and your health needs are our priority.  As part of our continuing mission to provide you with exceptional heart care, we have created designated Provider Care Teams.  These Care Teams include your primary Cardiologist (physician) and Advanced Practice Providers (APPs -  Physician Assistants and Nurse Practitioners) who all work together to provide you with the care you need, when you need it.

## 2019-10-15 ENCOUNTER — Encounter: Payer: Self-pay | Admitting: Cardiology

## 2019-10-15 NOTE — Assessment & Plan Note (Signed)
Now status post 26 mm CoreValve TAVR placement. Murmur has notably reduced, but suspect that there may still be some mild size related turbulence of flow.  Her last follow-up echo was outstanding.  NYHA Class I symptoms with no minimal exertional dyspnea and no other concerning symptoms.

## 2019-10-15 NOTE — Assessment & Plan Note (Signed)
Her home recordings were actually indicate that the blood pressure recorded today is actually mostly whitecoat hypertension.  The fact that her pressures at home are well controlled would not mean that I would be reluctant to actually treat her current pressures.  Continue to monitor and we can discuss in follow-up. Discussed importance of avoiding salty foods and other foods of concern.  We will reevaluate via in person or telehealth medicine in roughly 2 to 3 months to reassess her blood pressure.

## 2019-10-15 NOTE — Assessment & Plan Note (Signed)
Borderline symptomatic severe AS, referred for TAVR.  Had relatively rapid progression of leading to desire to treat early.

## 2019-11-24 ENCOUNTER — Other Ambulatory Visit: Payer: Self-pay | Admitting: Physician Assistant

## 2019-11-24 NOTE — Telephone Encounter (Signed)
PLAVIX IS NOT ANTICOAGULANT

## 2019-12-05 ENCOUNTER — Telehealth: Payer: Self-pay | Admitting: Cardiology

## 2019-12-05 NOTE — Telephone Encounter (Signed)
New Message   Pt c/o medication issue:  1. Name of Medication: clopidogrel (PLAVIX) 75 MG tablet   2. How are you currently taking this medication (dosage and times per day)? : TAKE ONE TABLET BY MOUTH DAILY with breakfast  3. Are you having a reaction (difficulty breathing--STAT)?   4. What is your medication issue? Patient is calling because she wants to know should she still be taking the plavix before she picks up at the drug store please call.

## 2019-12-05 NOTE — Telephone Encounter (Signed)
Pt questioning as to how long she should continue with plavix. Will route to MD.

## 2019-12-06 NOTE — Telephone Encounter (Signed)
Called  Spoke to patient.  Patient states she took her last dose the morning of plavix.  RN informed patient per Office visit on Jul 06, 2019 with K. [employer]. Patient can stop taking Plavix in the month of June 2021 once bottle is completed.   Patient verbalized understanding.  medication list update

## 2020-01-13 ENCOUNTER — Telehealth: Payer: Self-pay | Admitting: Cardiology

## 2020-01-13 NOTE — Telephone Encounter (Signed)
Patient states she was stung by yellow jackets on her wrist, on 7/21. Legacy states she hasn't felt good since getting stung, there is fever in it with redness and swelling going up her arm, she states it is about 5 inches up her arm. Dan has an NP friend who is worried that it might be soft tissue infection. Jewel is worried that it might be the infection her friend has mentioned and also because she has a heart valve replacement.

## 2020-01-13 NOTE — Telephone Encounter (Signed)
Spoke to patient.  Informed patient she would need it evaluated by primary or go to Urgent Care to see if antibiotic is needed.  patient wanted the information given to  Doctor of the day . Rn informed patient  Will hang up so she can contact primary and Rn will talk with the doctor of the day and call her back .

## 2020-01-13 NOTE — Telephone Encounter (Signed)
Reviewed with the doctor of the day .    per order patient will need to evaluated by primary or got Urgent care   patient aware.

## 2020-03-13 ENCOUNTER — Other Ambulatory Visit: Payer: Self-pay | Admitting: Physician Assistant

## 2020-03-13 DIAGNOSIS — Z952 Presence of prosthetic heart valve: Secondary | ICD-10-CM

## 2020-05-24 ENCOUNTER — Encounter: Payer: Self-pay | Admitting: Physician Assistant

## 2020-05-24 ENCOUNTER — Other Ambulatory Visit: Payer: Self-pay

## 2020-05-24 ENCOUNTER — Ambulatory Visit: Payer: Medicare Other | Admitting: Physician Assistant

## 2020-05-24 ENCOUNTER — Ambulatory Visit (HOSPITAL_COMMUNITY): Payer: Medicare Other | Attending: Cardiovascular Disease

## 2020-05-24 VITALS — BP 140/60 | HR 64 | Ht <= 58 in | Wt 119.8 lb

## 2020-05-24 DIAGNOSIS — I10 Essential (primary) hypertension: Secondary | ICD-10-CM

## 2020-05-24 DIAGNOSIS — Z952 Presence of prosthetic heart valve: Secondary | ICD-10-CM

## 2020-05-24 DIAGNOSIS — I059 Rheumatic mitral valve disease, unspecified: Secondary | ICD-10-CM

## 2020-05-24 HISTORY — PX: TRANSTHORACIC ECHOCARDIOGRAM: SHX275

## 2020-05-24 LAB — ECHOCARDIOGRAM COMPLETE
AR max vel: 1.4 cm2
AV Area VTI: 1.53 cm2
AV Area mean vel: 1.38 cm2
AV Mean grad: 12 mmHg
AV Peak grad: 20.4 mmHg
Ao pk vel: 2.26 m/s
Area-P 1/2: 2.73 cm2
MV M vel: 5.1 m/s
MV Peak grad: 104 mmHg
MV VTI: 1.44 cm2
Radius: 0.4 cm
S' Lateral: 2.4 cm

## 2020-05-24 NOTE — Patient Instructions (Addendum)
Medication Instructions:  Your provider recommends that you continue on your current medications as directed. Please refer to the Current Medication list given to you today.   *If you need a refill on your cardiac medications before your next appointment, please call your pharmacy*  Follow-Up: You are scheduled with Dr. Herbie Baltimore on December 10, 2020 at 1:20PM.

## 2020-05-24 NOTE — Progress Notes (Signed)
[place]                                       Cardiology Office Note    Date:  05/24/2020   ID:  Crystal Rose, DOB 1946/01/08, MRN [id number]  PCP:  [name], NP  Cardiologist: [name], MD / Dr. Angelena Form and Dr. Cyndia Bent (TAVR)  CC: 1 year s/p TAVR  History of Present Illness:  Crystal Rose is a 74 y.o. 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   She has a history of moderate AS that was previously followed at [facility].She established care with Dr. Ellyn Hack in October of 2020 and reported exertional fatigue and shortness of breath as well as some chest pressure and dizziness. Echocardiogram on 04/29/2019 which showed a calcified aortic valve with restricted leaflet mobility. The mean gradient was 41 mmHg with a peak gradient of 70.6 mmHg. There was mild aortic insufficiency. Left ventricular ejection fraction was normal. She underwent cardiac catheterization showed no coronary disease and normal right heart pressures.   She was evaluated by the multidisciplinary valve team and underwent successful TAVR with a 26 mmMedtronic CoreValve Evolut Pro + THV via the TF approach on 06/07/19. Post operative echo showed EF 65-70%, normally functioning TAVR with a mean gradient of 7 mm Hg and no PVL. She had intermittent LBBB and RBBB so a zio patch was placed at discharge which did not show HAVB. She was discharged on aspirin and plavix. 1 month echo showed  EF 65%, normally functioning TAVR with mean gradient of 13 mm Hg and no PVL.  Today she presents to clinic for follow up. No CP or SOB. No LE edema, orthopnea or PND. No dizziness or syncope. No blood in stool or urine. No palpitations. Has gained a little weight recently. Brought in a log of BPs that are mostly in normal range.    Past Medical History:  Diagnosis Date  . GERD  (gastroesophageal reflux disease)   . H/O foot surgery   . Hypertension   . Osteoporosis, post-menopausal   . RBBB   . S/P TAVR (transcatheter aortic valve replacement)   . Severe aortic stenosis 04/06/2019   2D Echo [facility]) 02/16/2019: Severe aortic stenosis.  (Peak velocity 4.1 m/sec, mean gradient 40.0 mmHg, peak gradient 69 mmHg).     Past Surgical History:  Procedure Laterality Date  . CORONARY ANGIOGRAPHY N/A 04/29/2019   Procedure: CORONARY ANGIOGRAPHY;  Surgeon: [name], MD;  Location: [place] [facility];  Service: Cardiovascular;  Laterality: N/A;  . INTRAOPERATIVE TRANSTHORACIC ECHOCARDIOGRAM N/A 06/07/2019   Procedure: TRANSTHORACIC ECHOCARDIOGRAM (TEE);  Surgeon: Burnell Blanks, MD;  Location: [place];  Service: Open Heart Surgery;  Laterality: N/A;  . RIGHT HEART CATH N/A 04/29/2019   Procedure: RIGHT HEART CATH;  Surgeon: [name], MD;  Location: [place] [facility];  Service: Cardiovascular;  Laterality: N/A;  . TRANSCATHETER AORTIC VALVE REPLACEMENT, TRANSFEMORAL N/A 06/07/2019   Procedure: TRANSCATHETER AORTIC VALVE REPLACEMENT, TRANSFEMORAL;  Surgeon: Burnell Blanks, MD;  Location: [facility];  Service: Open Heart Surgery;  RFA Access - 26- CoreValve Evolut Pro TAVR valve  . TRANSTHORACIC ECHOCARDIOGRAM  02/16/2019   2D Echo [facility]) : Normal LV size and function.  EF  60 to 65%.  Normal RV pressures.  GR 1 DD.  Severe aortic stenosis.  (Peak velocity 4.1 m/sec, mean gradient 40.0 mmHg, peak gradient 69 mmHg).   . TRANSTHORACIC ECHOCARDIOGRAM  12/15/'20; 1/13/'21   Day 1Postop Echo: AVA increased to 1.79 cm.  No paravalvular leak.  Valve well-positioned.  EF 65-70%.  No LVH.  Normal atrial sizes.  Moderate MAC.--> F/u Echo 07/06/19: EF 60 to 65%.  Suggested grade 2 diastolic function?  But with normal atrial size (unlikely grade 2 diastolic dysfunction).  AoV Mean gradient 9 mmHg, peak 13 mmHg.    Current Medications: Outpatient Medications  Prior to Visit  Medication Sig Dispense Refill  . acetaminophen (TYLENOL 8 HOUR) 650 MG CR tablet Take 1,300 mg by mouth every 8 (eight) hours as needed for pain.     Marland Kitchen amoxicillin (AMOXIL) 500 MG tablet Take 4 pills (2000 mg) 1 hour before your dental cleanings/procedures 4 tablet 3  . Ascorbic Acid (VITAMIN C) 1000 MG tablet Take 1,000 mg by mouth daily.    Marland Kitchen aspirin (ASPIRIN 81) 81 MG EC tablet Take 81 mg by mouth daily.     . B Complex Vitamins (VITAMIN B COMPLEX) TABS Take 1 tablet by mouth daily.     . Calcium Carbonate-Vit D-Min (CALCIUM 1200 PO) Take 1 tablet by mouth 2 (two) times daily.     . cholecalciferol (VITAMIN D3) 25 MCG (1000 UT) tablet Take 1,000 Units by mouth daily.    . cimetidine (TAGAMET) 200 MG tablet Take 200 mg by mouth daily.    Marland Kitchen CINNAMON PO Take 1,000 mg by mouth 2 (two) times daily.     . diphenhydrAMINE HCl (BENADRYL ALLERGY PO) Take by mouth as needed.    Marland Kitchen levocetirizine (XYZAL) 5 MG tablet Take 5 mg by mouth every evening.     . Magnesium 250 MG TABS Take 250 mg by mouth daily.    . multivitamin-lutein (OCUVITE-LUTEIN) CAPS capsule Take 1 capsule by mouth daily.    . Lactobacillus-Inulin (CULTURELLE DIGESTIVE HEALTH PO) Take 1 capsule by mouth daily. (Patient not taking: Reported on 05/24/2020)     No facility-administered medications prior to visit.     Allergies:   5-alpha reductase inhibitors   Social History   Socioeconomic History  . Marital status: Widowed    Spouse name: Not on file  . Number of children: 1  . Years of education: Not on file  . Highest education level: Associate degree: occupational, Hotel manager, or vocational program  Occupational History  . Occupation: Retired-pharmacy tech/hairdresser  Tobacco Use  . Smoking status: Never Smoker  . Smokeless tobacco: Never Used  Vaping Use  . Vaping Use: Never used  Substance and Sexual Activity  . Alcohol use: Not Currently  . Drug use: Never  . Sexual activity: Not on file  Other  Topics Concern  . Not on file  Social History Narrative   She is a retired Education administrator.  Has 1 daughter that she lives with in [place].      Does not exercise regularly, but does yard work and gardening.   She lives an active lifestyle and tends to have a relatively healthy diet.      Her daughter is a grade school teacher, and [place] has been making masks for her daughter to wear at school.   Social Determinants of Health   Financial Resource Strain:   . Difficulty of Paying Living Expenses: Not on file  Food Insecurity:   .  Worried About Charity fundraiser in the Last Year: Not on file  . Ran Out of Food in the Last Year: Not on file  Transportation Needs:   . Lack of Transportation (Medical): Not on file  . Lack of Transportation (Non-Medical): Not on file  Physical Activity:   . Days of Exercise per Week: Not on file  . Minutes of Exercise per Session: Not on file  Stress:   . Feeling of Stress : Not on file  Social Connections:   . Frequency of Communication with Friends and Family: Not on file  . Frequency of Social Gatherings with Friends and Family: Not on file  . Attends Religious Services: Not on file  . Active Member of Clubs or Organizations: Not on file  . Attends Archivist Meetings: Not on file  . Marital Status: Not on file     Family History:  The patient's family history includes Alcoholism in her brother; Breast cancer (age of onset: 46) in her mother; Dementia in her sister; Drug abuse in her brother; Heart attack (age of onset: 1) in her mother.     ROS:   Please see the history of present illness.    ROS All other systems reviewed and are negative.   PHYSICAL EXAM:   VS:  BP 140/60   Pulse 64   Ht _0  (1.473 m)   Wt 119 lb 12.8 oz (54.3 kg)   SpO2 97%   BMI 25.04 kg/m    GEN: Well nourished, well developed, in no acute distress HEENT: normal Neck: no JVD or masses Cardiac: RRR; 2/6 murmur @ RUSB. No rubs, or gallops,no  edema  Respiratory:  clear to auscultation bilaterally, normal work of breathing GI: soft, nontender, nondistended, + BS MS: no deformity or atrophy Skin: warm and dry, no rash. Neuro:  Alert and Oriented x 3, Strength and sensation are intact Psych: euthymic mood, full affect   Wt Readings from Last 3 Encounters:  05/24/20 119 lb 12.8 oz (54.3 kg)  10/12/19 116 lb 3.2 oz (52.7 kg)  07/06/19 116 lb (52.6 kg)      Studies/Labs Reviewed:   EKG:  EKG is NOT ordered today.    Recent Labs: 06/03/2019: ALT 24; B Natriuretic Peptide 125.2 06/08/2019: BUN 15; Creatinine, Ser 0.74; Hemoglobin 10.1; Magnesium 1.8; Platelets 214; Potassium 3.9; Sodium 135   Lipid Panel No results found for: CHOL, TRIG, HDL, CHOLHDL, VLDL, LDLCALC, LDLDIRECT  Additional studies/ records that were reviewed today include:  TAVR OPERATIVE NOTE  Date of Procedure:06/07/2019  Preoperative Diagnosis:Severe Aortic Stenosis   Postoperative Diagnosis:Same   Procedure:   Transcatheter Aortic Valve Replacement - PercutaneousRightTransfemoral Approach Medtronic CoreValve Evolut Pro +(size 37m, serial # [id number]  Co-Surgeons:Bryan KAlveria Apley MD and CLauree Chandler MD   Anesthesiologist:Carswell JGlennon Mac MD  Echocardiographer:Mihai Croitoru, MD  Pre-operative Echo Findings: ? Severe aortic stenosis ? Normal left ventricular systolic function  Post-operative Echo Findings: ? Noparavalvular leak ? Normal left ventricular systolic function   _____________    Echo 06/08/19: IMPRESSIONS  1. 26 mm Corevalve in aortic position. Vmax 1.85 m/s, MG 7 mmHG, AVA 1.79 cm2. No paravalvular leak. Normal functioning TAVR valve.  2. Left ventricular ejection fraction, by visual estimation, is 65 to 70%. The left ventricle has normal function. There is no left ventricular  hypertrophy.  3. The left ventricle has no regional wall motion abnormalities.  4. Global right ventricle has normal systolic function.The right ventricular size is normal. No increase in  right ventricular wall thickness.  5. Left atrial size was normal.  6. Right atrial size was normal.  7. Presence of pericardial fat pad.  8. Trivial pericardial effusion is present.  9. Moderate mitral annular calcification. 10. The mitral valve is degenerative. Trivial mitral valve regurgitation. Mild mitral stenosis. 11. The tricuspid valve is grossly normal. Tricuspid valve regurgitation is trivial. 12. Aortic valve regurgitation is not visualized. 13. The pulmonic valve was grossly normal. Pulmonic valve regurgitation is not visualized. 14. Normal pulmonary artery systolic pressure. 15. The inferior vena cava is normal in size with <50% respiratory variability, suggesting right atrial pressure of 8 mmHg. 16. Mild mitral stenosis (mean gradient 5.8 mmHG @ 81 bpm). Related to mitral annular calcification. 17. Changes from prior study are noted.  Aortic Valve: The aortic valve has been repaired/replaced. Aortic valve regurgitation is not visualized. Aortic valve mean gradient measures 7.0 mmHg. Aortic valve peak gradient measures 13.7 mmHg. Aortic valve area, by VTI measures 1.79 cm. 26  Medtronic CoreValve-Evolut Pro bioprosthetic, stented aortic valve (TAVR) valve is present in the aortic position. Procedure Date: 06/07/2019. 26 mm Corevalve in aortic position. Vmax 1.85 m/s, MG 7 mmHG, AVA 1.79 cm2. No paravalvular leak. Normal functioning TAVR valve.   _______________   Echo 07/06/19 IMPRESSIONS  1. Left ventricular ejection fraction, by visual estimation, is 60 to 65%. The left ventricle has normal function. There is no left ventricular hypertrophy.  2. Elevated left atrial pressure.  3. Left ventricular diastolic parameters are consistent with Grade II diastolic dysfunction  (pseudonormalization).  4. The left ventricle has no regional wall motion abnormalities.  5. Global right ventricle has normal systolic function.The right ventricular size is normal. No increase in right ventricular wall thickness.  6. Left atrial size was normal.  7. Right atrial size was normal.  8. Mild mitral annular calcification.  9. The mitral valve is normal in structure. Mild mitral valve regurgitation. No evidence of mitral stenosis. 10. The tricuspid valve is normal in structure. 11. Aortic valve regurgitation is not visualized. No evidence of aortic valve sclerosis or stenosis. 12. The pulmonic valve was normal in structure. Pulmonic valve regurgitation is trivial. 13. Mildly elevated pulmonary artery systolic pressure. 14. The tricuspid regurgitant velocity is 2.80 m/s, and with an assumed right atrial pressure of 3 mmHg, the estimated right ventricular systolic pressure is mildly elevated at 34.4 mmHg. 15. The inferior vena cava is normal in size with greater than 50% respiratory variability, suggesting right atrial pressure of 3 mmHg. 16. MV mean gradient, 8 mmHg at 75 bpm. 17. Prior images reviewed side by side. 18. Compared to 06/08/2019, the mitral valve gradients are higher, probably due to an increase in cardiac output (pressure half time is unchanged). Otherwise no changes are seen.   Aortic Valve: The aortic valve has been repaired/replaced. Aortic valve regurgitation is not visualized. The aortic valve is structurally normal, with no evidence of sclerosis or stenosis. Aortic valve mean gradient measures 9.0 mmHg. Aortic valve peak gradient measures 13.0 mmHg. Aortic valve area, by VTI measures 1.56 cm. 26 CoreValve-Evolut Pro bioprosthetic, stented aortic valve (TAVR) valve is present in the aortic position. Procedure Date: 06/07/19.  _____________________  Echo 05/24/20 IMPRESSIONS    1. 26 mm Evolut Pro in aortic position. Vmax 2.3 m/s, MG 12 mmHG, EOA 1.53 cm2,  DI 0.49. No regurgitation or paravalvular leak. Normal functioning prosthesis. The aortic valve has been repaired/replaced. Aortic valve regurgitation is not visualized.  There is a 26 mm CoreValve-Evolut Pro  prosthetic (TAVR) valve present in the aortic position. Procedure Date: 06/07/2019.  2. Moderate calcific mitral stenosis is present. Mean gradient 7.2 mmHG @ 70 bpm. MVA by VTI 1.44 cm2. Gradients were high on last study. The mitral valve is degenerative. Mild mitral valve regurgitation. Moderate mitral stenosis. The mean mitral valve  gradient is 7.2 mmHg with average heart rate of 70 bpm. Moderate to severe mitral annular calcification.  3. Left ventricular ejection fraction, by estimation, is 65 to 70%. The left ventricle has normal function. The left ventricle has no regional wall motion abnormalities. Left ventricular diastolic function could not be evaluated.  4. Right ventricular systolic function is normal. The right ventricular size is normal. There is normal pulmonary artery systolic pressure. The estimated right ventricular systolic pressure is 54.6 mmHg.  5. The inferior vena cava is normal in size with greater than 50% respiratory variability, suggesting right atrial pressure of 3 mmHg.  Comparison(s): No significant change from prior study. Normal functioning TAVR. No PVL. Moderate to severe calcific MS similar to prior.  ASSESSMENT & PLAN:   Severe AS s/p TAVR: echo today shows EF 65%, normally functioning TAVR with a mean gradient of 12 mm hg and no PVL. She has NYHA class I symptoms.  SBE prophylaxis discussed; she has amoxicillin. Continue on aspirin 81 mg daily alone.   HTN: BP mildly elevated today. I reviewed a log from home which showed mostly well controlled pressures. She will continue to monitor it for [place].   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   Medication Adjustments/Labs and Tests Ordered: Current medicines are reviewed at length with  the patient today.  Concerns regarding medicines are outlined above.  Medication changes, Labs and Tests ordered today are listed in the Patient Instructions below. Patient Instructions  Medication Instructions:  Your provider recommends that you continue on your current medications as directed. Please refer to the Current Medication list given to you today.   *If you need a refill on your cardiac medications before your next appointment, please call your pharmacy*  Follow-Up: You are scheduled with Dr. Ellyn Hack on December 10, 2020 at 1:20PM.    Signed, Angelena Form, PA-C  05/24/2020 5:14 PM    [address], [place], [place]  [address] Phone: [phone]; Fax: [phone]

## 2020-07-30 ENCOUNTER — Other Ambulatory Visit: Payer: Self-pay | Admitting: Family

## 2020-07-30 DIAGNOSIS — Z1231 Encounter for screening mammogram for malignant neoplasm of breast: Secondary | ICD-10-CM

## 2020-10-01 ENCOUNTER — Ambulatory Visit: Payer: Medicare Other

## 2020-10-02 ENCOUNTER — Ambulatory Visit
Admission: RE | Admit: 2020-10-02 | Discharge: 2020-10-02 | Disposition: A | Discharge status: Home / Self Care | Payer: Medicare Other | Source: Ambulatory Visit | Attending: Family | Admitting: Family

## 2020-10-02 ENCOUNTER — Other Ambulatory Visit: Payer: Self-pay

## 2020-10-02 DIAGNOSIS — Z1231 Encounter for screening mammogram for malignant neoplasm of breast: Secondary | ICD-10-CM

## 2020-12-10 ENCOUNTER — Encounter: Payer: Self-pay | Admitting: Cardiology

## 2020-12-10 ENCOUNTER — Ambulatory Visit: Payer: Medicare Other | Admitting: Cardiology

## 2020-12-10 ENCOUNTER — Other Ambulatory Visit: Payer: Self-pay

## 2020-12-10 VITALS — BP 130/62 | HR 70 | Ht <= 58 in | Wt 123.2 lb

## 2020-12-10 DIAGNOSIS — I050 Rheumatic mitral stenosis: Secondary | ICD-10-CM

## 2020-12-10 DIAGNOSIS — I10 Essential (primary) hypertension: Secondary | ICD-10-CM | POA: Diagnosis not present

## 2020-12-10 DIAGNOSIS — Z952 Presence of prosthetic heart valve: Secondary | ICD-10-CM | POA: Diagnosis not present

## 2020-12-10 DIAGNOSIS — I4510 Unspecified right bundle-branch block: Secondary | ICD-10-CM | POA: Diagnosis not present

## 2020-12-10 DIAGNOSIS — E785 Hyperlipidemia, unspecified: Secondary | ICD-10-CM

## 2020-12-10 NOTE — Progress Notes (Signed)
Primary Care Provider: [name], NP Cardiologist: [name], MD Electrophysiologist: None  Clinic Note: Chief Complaint  Patient presents with   Follow-up    Annual.  Doing well   Cardiac Valve Problem    Status post TAVR.  Last seen in TAVR clinic May 24, 2020 to follow-up echocardiogram.     ===================================  ASSESSMENT/PLAN   Problem List Items Addressed This Visit     RBBB   Relevant Orders   EKG 12-Lead (Completed)   Hypertension (Chronic)    Current blood pressure is well within goal for 75 year old.  Not on any medications.  Continue to monitor.       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  Valve functioning well on follow-up echocardiogram.  EF normal.  NYHA class I if that CHF symptoms.  Minimal exertional dyspnea.  Otherwise stable.  Plan: Continue aspirin SBE prophylaxis prescription for amoxicillin provided.  Discussed.       Relevant Orders   ECHOCARDIOGRAM COMPLETE   Moderate mitral stenosis by prior echocardiography (Chronic)    She does have moderate mitral stenosis noted on echo which also seems to be stable.  Not having any significant symptoms.  We will continue to follow-up with echoes at least annually for the next few years and then if stable can back off.  Discussed concerning symptoms to be on look out for.       Relevant Orders   ECHOCARDIOGRAM COMPLETE   Hyperlipidemia LDL goal <100 (Chronic)    Interestingly, her lipids got a little worse from 20 20-20 21.  Due for follow-up check in about 2 months.  LDL was still 10 once a relatively well within the range of goal.  She is not on any statin or other medications.  Continue to monitor for now.  She is reluctant to start any medications, as long as she is relatively stable in this range, would not start.         ===================================  HPI:    Crystal Rose is a 75 y.o. female with a PMH notable for History of Severe Aortic Stenosis S/P TAVR (06/07/2019) as well as Moderate Mitral Stenosis who presents today for annual follow-up.  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  Noted that she may get little short of breath after long walk.  A little out of shape.  Short of breath walking uphill  or carrying something up steps.  Otherwise stable for chronic standpoint.  Blood pressures have been pretty well controlled.  Usually in the 120s/60s mmHg.  Not really able to tell much difference in the level of dyspnea pre-Versus post TAVR.  Recent Hospitalizations: None  She was seen in TAVR clinic on May 24, 2020 by Angelena Form, PA.  No chest pain or dyspnea.  No edema.  No orthopnea PND.  No palpitations, syncope or near syncope.  Mostly normal range BPs.  Stable echo.  NYHA class I symptoms.  SBE prophylaxis-amoxicillin.  Not on aspirin.   Reviewed  CV studies:    The following studies were reviewed today: (if available, images/films reviewed: From Epic Chart or Care Everywhere) TTE 05/24/2020: 26 mm CoreValve  Evolut Pro T HV in aortic position.  No PVL.  Normal position.  No significant stenosis.  Moderate calcific mitral stenosis with mean gradient 7.2 mmHg at 70 bpm.  Degenerative mitral valve with mild and moderate MS (similar to prior study).  EF 65 to 70%.  No R WMA.   Interval History:   Crystal Rose returns here today overall feeling quite well.  She says she occasionally gets dizzy if she stands up too quickly.  But otherwise no major complaints.  She says that she has made an attempt to change her diet mostly cutting out sweets and unnecessary carbohydrates.  She is  very active doing yard work, gardening and walking around the neighborhood.  She does not have a routine "exercise regimen ".  Pretty much asymptomatic from a cardiac standpoint besides occasional dizziness.  CV Review of Symptoms (Summary): positive for - off & on dizziness with standing up too quickly or standing up after bending over negative for - chest pain, dyspnea on exertion, edema, irregular heartbeat, orthopnea, palpitations, paroxysmal nocturnal dyspnea, rapid heart rate, shortness of breath, or syncope/near syncope or TIA/amaurosis fugax, claudication  REVIEWED OF SYSTEMS   Review of Systems  Constitutional:  Negative for malaise/fatigue and weight loss.  HENT:  Negative for congestion and nosebleeds.   Respiratory: Negative.    Cardiovascular:        Per HPI  Gastrointestinal:  Negative for blood in stool and melena.  Genitourinary:  Negative for hematuria.  Musculoskeletal:  Negative for joint pain.  Neurological:  Positive for dizziness (Per HPI). Negative for focal weakness and weakness.  Psychiatric/Behavioral: Negative.    Essentially negative  I have reviewed and (if needed) personally updated the patient's problem list, medications, allergies, past medical and surgical history, social and family history.   PAST MEDICAL HISTORY   Past Medical History:  Diagnosis Date   GERD (gastroesophageal reflux disease)    H/O foot surgery    Hypertension    Osteoporosis, post-menopausal    RBBB    S/P TAVR (transcatheter aortic valve replacement)    Severe aortic stenosis 04/06/2019   2D Echo [facility]) 02/16/2019: Severe aortic stenosis.  (Peak velocity 4.1 m/sec, mean gradient 40.0 mmHg, peak gradient 69 mmHg).     PAST SURGICAL HISTORY   Past Surgical History:  Procedure Laterality Date   CORONARY ANGIOGRAPHY N/A 04/29/2019   Procedure: CORONARY ANGIOGRAPHY;  Surgeon: [name], MD;  Location: [place] [facility];  Service: Cardiovascular;  Laterality: N/A;    INTRAOPERATIVE TRANSTHORACIC ECHOCARDIOGRAM N/A 06/07/2019   Procedure: TRANSTHORACIC ECHOCARDIOGRAM (TEE);  Surgeon: Burnell Blanks, MD;  Location: [place];  Service: Open Heart Surgery;  Laterality: N/A;   RIGHT HEART CATH N/A 04/29/2019   Procedure: RIGHT HEART CATH;  Surgeon: [name], MD;  Location: [place] [facility];  Service: Cardiovascular;  Laterality: N/A;   TRANSCATHETER AORTIC VALVE REPLACEMENT, TRANSFEMORAL N/A 06/07/2019   Procedure: TRANSCATHETER AORTIC VALVE REPLACEMENT, TRANSFEMORAL;  Surgeon: Burnell Blanks, MD;  Location: [facility];  Service: Open Heart Surgery;  RFA Access - 26- CoreValve Evolut Pro TAVR valve   TRANSTHORACIC ECHOCARDIOGRAM  02/16/2019   2D Echo [facility]) : Normal LV size and function.  EF 60 to 65%.  Normal RV pressures.  GR 1 DD.  Severe aortic stenosis.  (Peak velocity 4.1 m/sec, mean gradient 40.0 mmHg, peak gradient 69 mmHg).    TRANSTHORACIC ECHOCARDIOGRAM  12/15/'20; 1/13/'21   Day 1Postop Echo: AVA increased to 1.79  cm.  No paravalvular leak.  Valve well-positioned.  EF 65-70%.  No LVH.  Normal atrial sizes.  Moderate MAC.--> F/u Echo 07/06/19: EF 60 to 65%.  Suggested grade 2 diastolic function?  But with normal atrial size (unlikely grade 2 diastolic dysfunction).  AoV Mean gradient 9 mmHg, peak 13 mmHg.   TRANSTHORACIC ECHOCARDIOGRAM  05/24/2020   1 year post TAVR: 26 mm CoreValve Evolut Pro T HV in aortic position.  No PVL.  Normal position.  No significant stenosis.  Moderate calcific mitral stenosis with mean gradient 7.2 mmHg at 70 bpm.  Degenerative mitral valve with mild and moderate MS (similar to prior study).  EF 65 to 70%.  No R WMA.   Zio patch monitor (December 2020 post TAVR): Heart rate ranged from 47 to 120 bpm with average 72 bpm.  Bradycardia noted in hours of sleep.  Rare PACs and PVCs.  17 short bursts from 4-15 beats SVT/PAT.  No pauses or heart block.  Immunization History  Administered Date(s)  Administered   [employer] Vaccination 09/09/2019, 10/07/2019    MEDICATIONS/ALLERGIES   Current Meds  Medication Sig   acetaminophen (TYLENOL) 650 MG CR tablet Take 1,300 mg by mouth every 8 (eight) hours as needed for pain.    amoxicillin (AMOXIL) 500 MG tablet Take 4 pills (2000 mg) 1 hour before your dental cleanings/procedures   Ascorbic Acid (VITAMIN C) 1000 MG tablet Take 1,000 mg by mouth daily.   aspirin 81 MG EC tablet Take 81 mg by mouth daily.    B Complex Vitamins (VITAMIN B COMPLEX) TABS Take 1 tablet by mouth daily.    Calcium Carbonate-Vit D-Min (CALCIUM 1200 PO) Take 1 tablet by mouth 2 (two) times daily.    cholecalciferol (VITAMIN D3) 25 MCG (1000 UT) tablet Take 1,000 Units by mouth daily.   cimetidine (TAGAMET) 200 MG tablet Take 200 mg by mouth daily.   CINNAMON PO Take 1,000 mg by mouth 2 (two) times daily.    diphenhydrAMINE HCl (BENADRYL ALLERGY PO) Take by mouth as needed.   levocetirizine (XYZAL) 5 MG tablet Take 5 mg by mouth every evening.    Magnesium 250 MG TABS Take 250 mg by mouth daily.   multivitamin-lutein (OCUVITE-LUTEIN) CAPS capsule Take 1 capsule by mouth daily.    Allergies  Allergen Reactions   5-Alpha Reductase Inhibitors     SOCIAL HISTORY/FAMILY HISTORY   Reviewed in Epic:  Pertinent findings:  Social History   Tobacco Use   Smoking status: Never   Smokeless tobacco: Never  Vaping Use   Vaping Use: Never used  Substance Use Topics   Alcohol use: Not Currently   Drug use: Never   Social History   Social History Narrative   She is a retired Education administrator.  Has 1 daughter that she lives with in [place].      Does not exercise regularly, but does yard work and gardening.   She lives an active lifestyle and tends to have a relatively healthy diet.      Her daughter is a grade school teacher, and Crystal Rose has been making masks for her daughter to wear at school.    OBJCTIVE -PE, EKG, labs   Wt Readings from  Last 3 Encounters:  12/10/20 123 lb 3.2 oz (55.9 kg)  05/24/20 119 lb 12.8 oz (54.3 kg)  10/12/19 116 lb 3.2 oz (52.7 kg)    Physical Exam: BP 130/62   Pulse 70   Ht _0  (1.473 m)  Wt 123 lb 3.2 oz (55.9 kg)   SpO2 96%   BMI 25.75 kg/m  Physical Exam Vitals reviewed.  Constitutional:      General: She is not in acute distress.    Appearance: Normal appearance. She is normal weight. She is not toxic-appearing.     Comments: Healthy-appearing woman.  Appears younger than stated age.  Well-nourished, well-groomed.  HENT:     Head: Normocephalic and atraumatic.  Neck:     Vascular: No carotid bruit (Radiated aortic murmur).  Cardiovascular:     Rate and Rhythm: Normal rate and regular rhythm.     Pulses: Normal pulses.     Heart sounds: Murmur (Harsh 2/6 SEM at RUSB-neck) heard.    No friction rub. No gallop.     Comments: No diastolic murmur heard. Pulmonary:     Effort: Pulmonary effort is normal. No respiratory distress.     Breath sounds: Normal breath sounds. No wheezing, rhonchi or rales.  Chest:     Chest wall: No tenderness.  Musculoskeletal:        General: No swelling. Normal range of motion.     Cervical back: Normal range of motion and neck supple.  Skin:    General: Skin is warm and dry.  Neurological:     General: No focal deficit present.     Mental Status: She is alert and oriented to person, place, and time.  Psychiatric:        Mood and Affect: Mood normal.        Behavior: Behavior normal.        Thought Content: Thought content normal.        Judgment: Judgment normal.    Adult ECG Report  Rate: 70;  Rhythm: normal sinus rhythm and normal axis, intervals and durations. ;   Narrative Interpretation: Stable  Recent Labs:   [place] Related to Basic Metabolic Panel Component 31/49/70  02/04/19   Sodium 137 136  Potassium 4.1 4.3  Chloride 103 102  CO2 24.0 26.0  Anion Gap 10 8  BUN 27 High  24 High   Creatinine 0.80 0.60   BUN/Creatinine Ratio 34 40  EGFR CKD-EPI Non-African American, Female 72 89  EGFR CKD-EPI African American, Female 83 >90  Glucose 110 High  109 High   Calcium 10.0 9.8    Lipid Panel Component 02/15/20 02/04/19  Triglycerides 70 94  Cholesterol 218 High  209 High   HDL 103 106  LDL Calculated 101 High   84   VLDL Cholesterol Cal 14 18.8  Chol/HDL Ratio 2.1 2.0  Non-HDL Cholesterol 115  103   FASTING Yes Unknown    CBC w/ Differential Component 02/15/20  02/04/19   WBC 6.4 4.7  RBC 3.96 3.81  HGB 12.7 12.4  HCT 37.8 36.1  MCV 95.5 94.8  MCH 32.1 32.5  MCHC 33.6 34.3  RDW 11.7 Low  12.0 Low   MPV 8.7 Low  8.6 Low   Platelet 315 331    ==================================================  COVID-19 Education: The signs and symptoms of COVID-19 were discussed with the patient and how to seek care for testing (follow up with PCP or arrange E-visit).    I spent a total of 41mnutes with the patient spent in direct patient consultation.  -> We reviewed her echo results, discussing TAVR but also the mitral stenosis. Additional time spent with chart review  / charting (studies, outside notes, etc): 8 min Total Time: 45 min  Current medicines are  reviewed at length with the patient today.  (+/- concerns) none  This visit occurred during the SARS-CoV-2 public health emergency.  Safety protocols were in place, including screening questions prior to the visit, additional usage of staff PPE, and extensive cleaning of exam room while observing appropriate contact time as indicated for disinfecting solutions.  Notice: This dictation was prepared with Dragon dictation along with smaller phrase technology. Any transcriptional errors that result from this process are unintentional and may not be corrected upon review.  Patient Instructions / Medication Changes & Studies & Tests Ordered   Patient Instructions  Medication Instructions:  No changes   *If you need a refill on your  cardiac medications before your next appointment, please call your pharmacy*   Lab Work:  Not needed   Testing/Procedures: Will schedule in Dec 2022 at [place] has requested that you have an echocardiogram. Echocardiography is a painless test that uses sound waves to create images of your heart. It provides your doctor with information about the size and shape of your heart and how well your heart's chambers and valves are working. This procedure takes approximately one hour. There are no restrictions for this procedure.    Follow-Up: At [facility], you and your health needs are our priority.  As part of our continuing mission to provide you with exceptional heart care, we have created designated Provider Care Teams.  These Care Teams include your primary Cardiologist (physician) and Advanced Practice Providers (APPs -  Physician Assistants and Nurse Practitioners) who all work together to provide you with the care you need, when you need it.     Your next appointment:   12 month(s)  The format for your next appointment:   In Person  Provider:   [name], MD    Studies Ordered:   Orders Placed This Encounter  Procedures   EKG 12-Lead   ECHOCARDIOGRAM COMPLETE      [name], M.D., M.S. Interventional Cardiologist   Pager # [phone] Phone # [phone] [address] [place], [place] [address]   Thank you for choosing Heartcare at [facility]

## 2020-12-10 NOTE — Patient Instructions (Addendum)
Medication Instructions:  No changes   *If you need a refill on your cardiac medications before your next appointment, please call your pharmacy*   Lab Work:  Not needed   Testing/Procedures: Will schedule in Dec 2022 at [address] Your physician has requested that you have an echocardiogram. Echocardiography is a painless test that uses sound waves to create images of your heart. It provides your doctor with information about the size and shape of your heart and how well your heart's chambers and valves are working. This procedure takes approximately one hour. There are no restrictions for this procedure.    Follow-Up: At [facility], you and your health needs are our priority.  As part of our continuing mission to provide you with exceptional heart care, we have created designated Provider Care Teams.  These Care Teams include your primary Cardiologist (physician) and Advanced Practice Providers (APPs -  Physician Assistants and Nurse Practitioners) who all work together to provide you with the care you need, when you need it.     Your next appointment:   12 month(s)  The format for your next appointment:   In Person  Provider:   Bryan Lemma, MD

## 2020-12-13 ENCOUNTER — Encounter: Payer: Self-pay | Admitting: Cardiology

## 2020-12-13 DIAGNOSIS — E785 Hyperlipidemia, unspecified: Secondary | ICD-10-CM | POA: Insufficient documentation

## 2020-12-13 NOTE — Assessment & Plan Note (Signed)
She does have moderate mitral stenosis noted on echo which also seems to be stable.  Not having any significant symptoms.  We will continue to follow-up with echoes at least annually for the next few years and then if stable can back off.  Discussed concerning symptoms to be on look out for.

## 2020-12-13 NOTE — Assessment & Plan Note (Signed)
Current blood pressure is well within goal for 75 year old.  Not on any medications.  Continue to monitor.

## 2020-12-13 NOTE — Assessment & Plan Note (Signed)
S/p 26 mm CorValve TAVR in Dec 2020 for Severe AS -doing remarkably well ever since.  Valve functioning well on follow-up echocardiogram.  EF normal.  NYHA class I if that CHF symptoms.  Minimal exertional dyspnea.  Otherwise stable.  Plan: Continue aspirin  SBE prophylaxis prescription for amoxicillin provided.  Discussed.

## 2020-12-13 NOTE — Assessment & Plan Note (Signed)
Interestingly, her lipids got a little worse from 20 20-20 21.  Due for follow-up check in about 2 months.  LDL was still 10 once a relatively well within the range of goal.  She is not on any statin or other medications.  Continue to monitor for now.  She is reluctant to start any medications, as long as she is relatively stable in this range, would not start.

## 2020-12-25 IMAGING — CR DG CHEST 2V
2 series · 2 of 2 positions shown · non-contrast
Comparison: CT chest 05/11/2019

CLINICAL DATA: Preop TAVR

EXAM:
CHEST - 2 VIEW

[w chest pa]
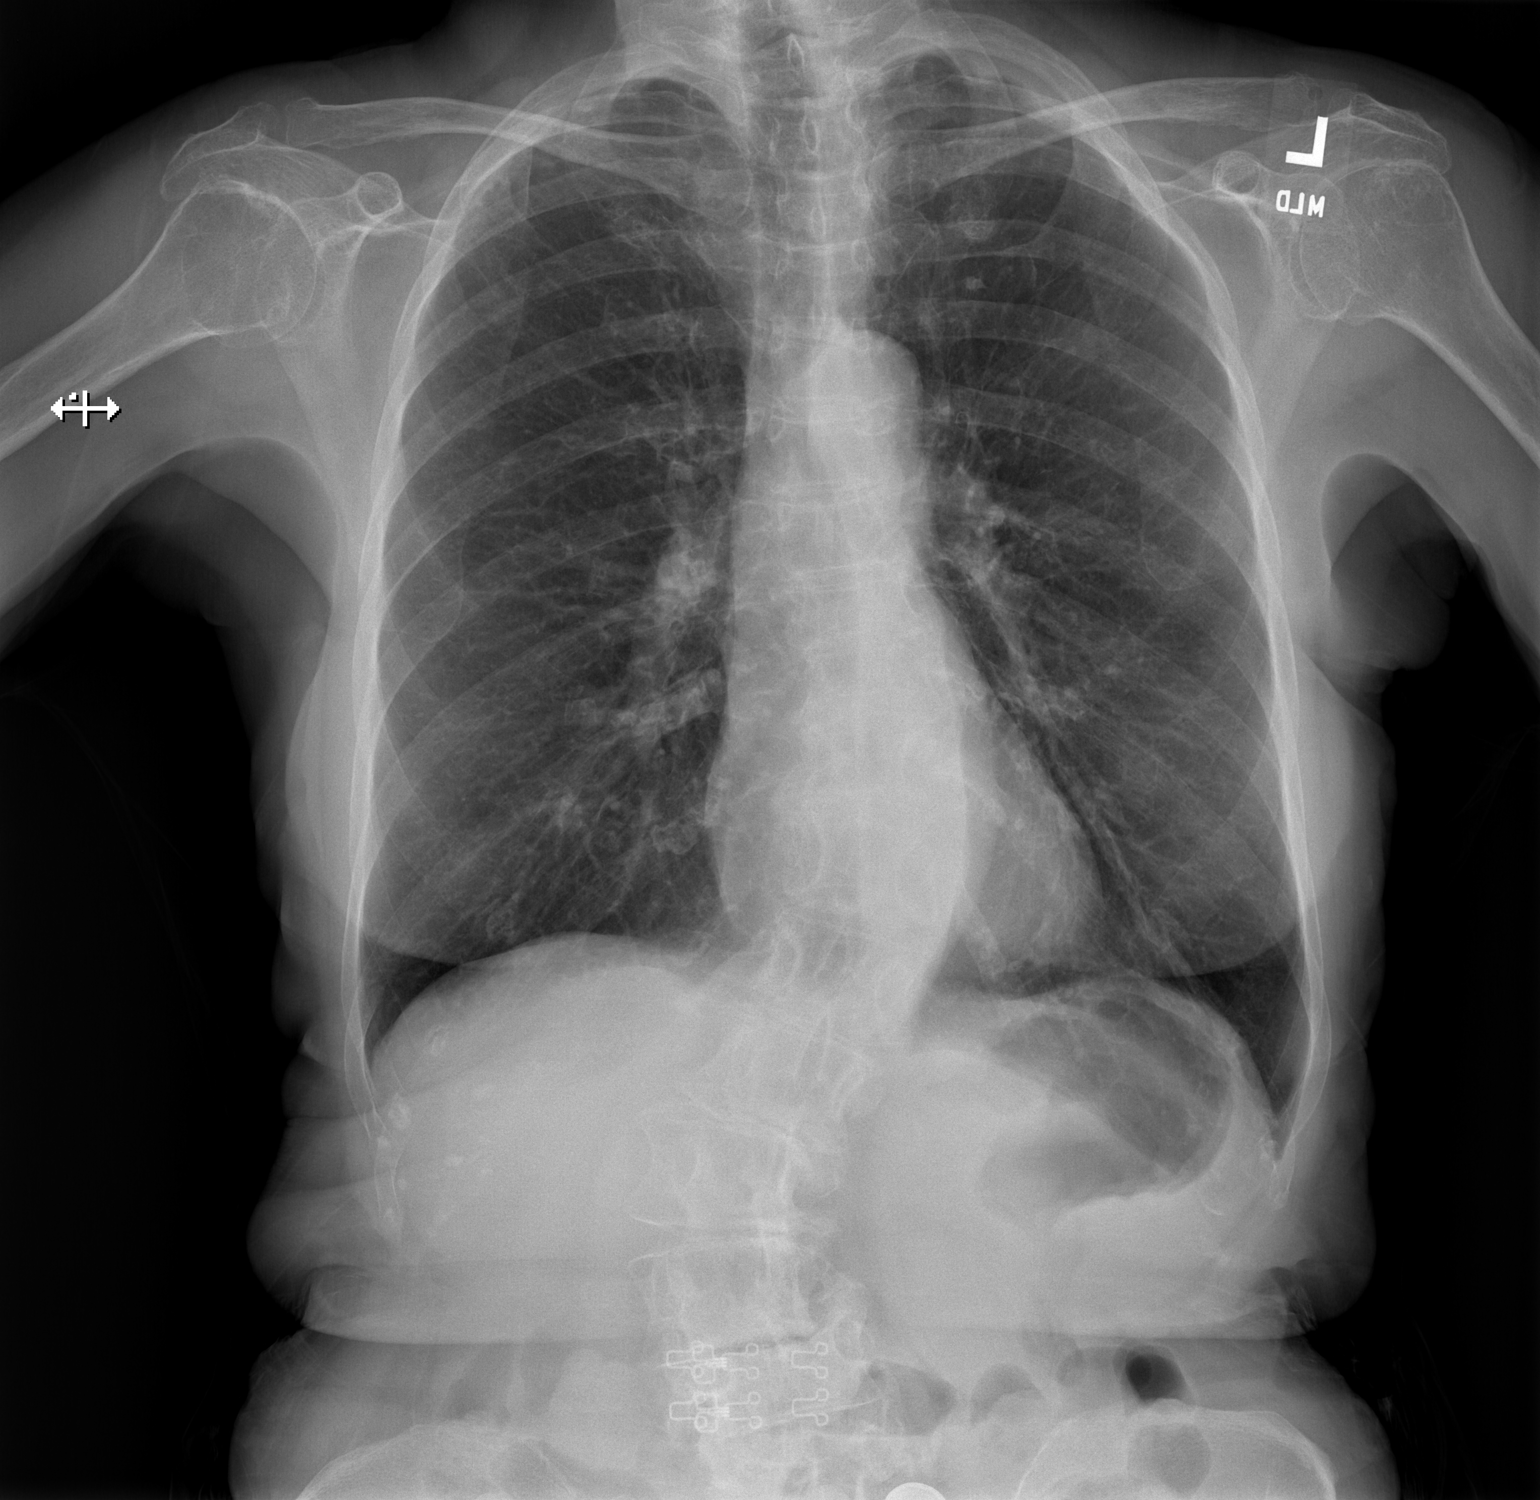

[w chest lat]
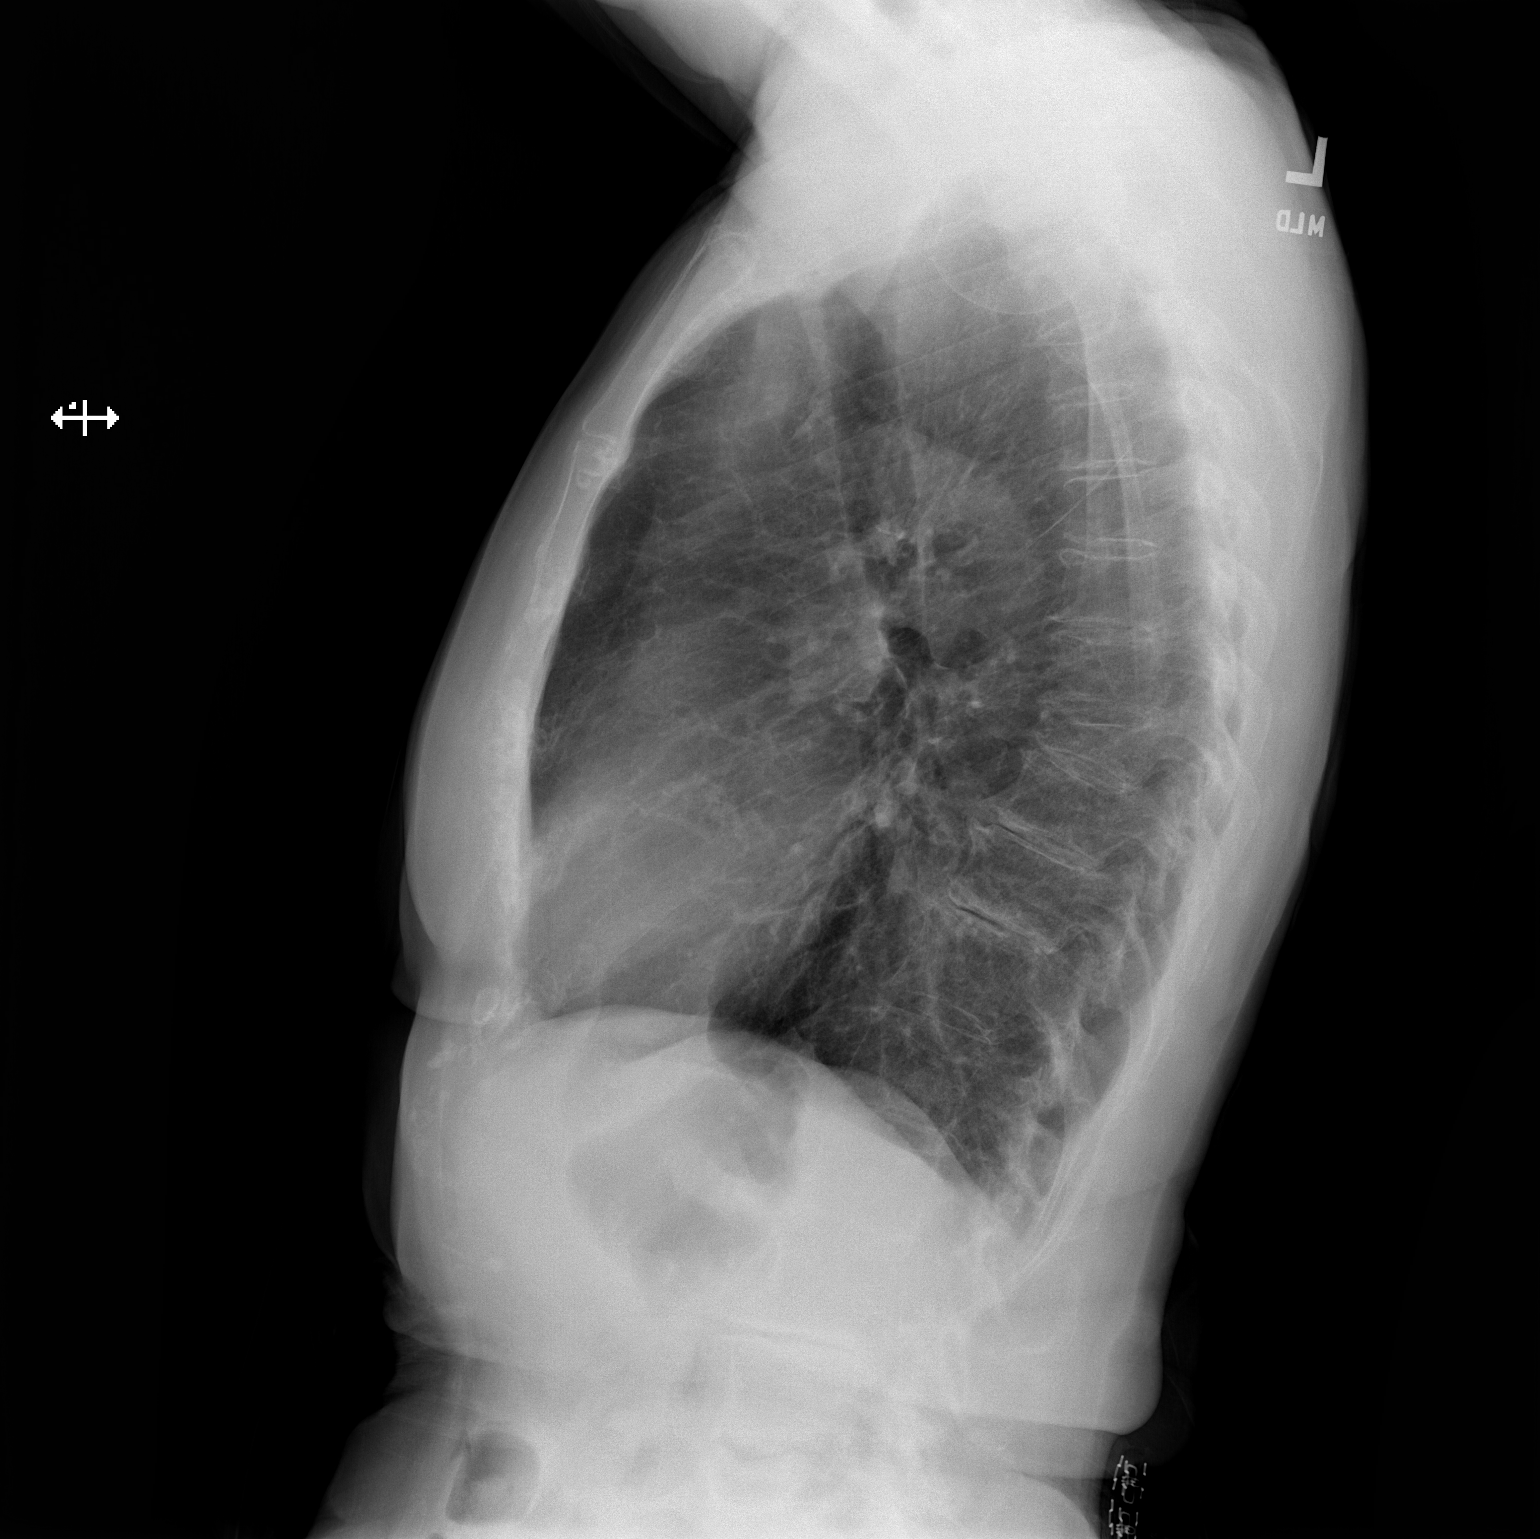

[2 of 2 positions shown; findings below may reference images not displayed]

FINDINGS: There is no focal consolidation. There is no pleural effusion or
pneumothorax. The heart and mediastinal contours are unremarkable.

There is an S-shaped scoliosis of the thoracolumbar spine. There is
a T9 vertebral body compression fracture.
IMPRESSION: No active cardiopulmonary disease.

## 2020-12-29 IMAGING — DX DG CHEST 1V PORT
1 series · 1 of 1 positions shown · non-contrast
Comparison: 06/03/2019

CLINICAL DATA: Status post TAVR

EXAM:
PORTABLE CHEST 1 VIEW

[chest ap]
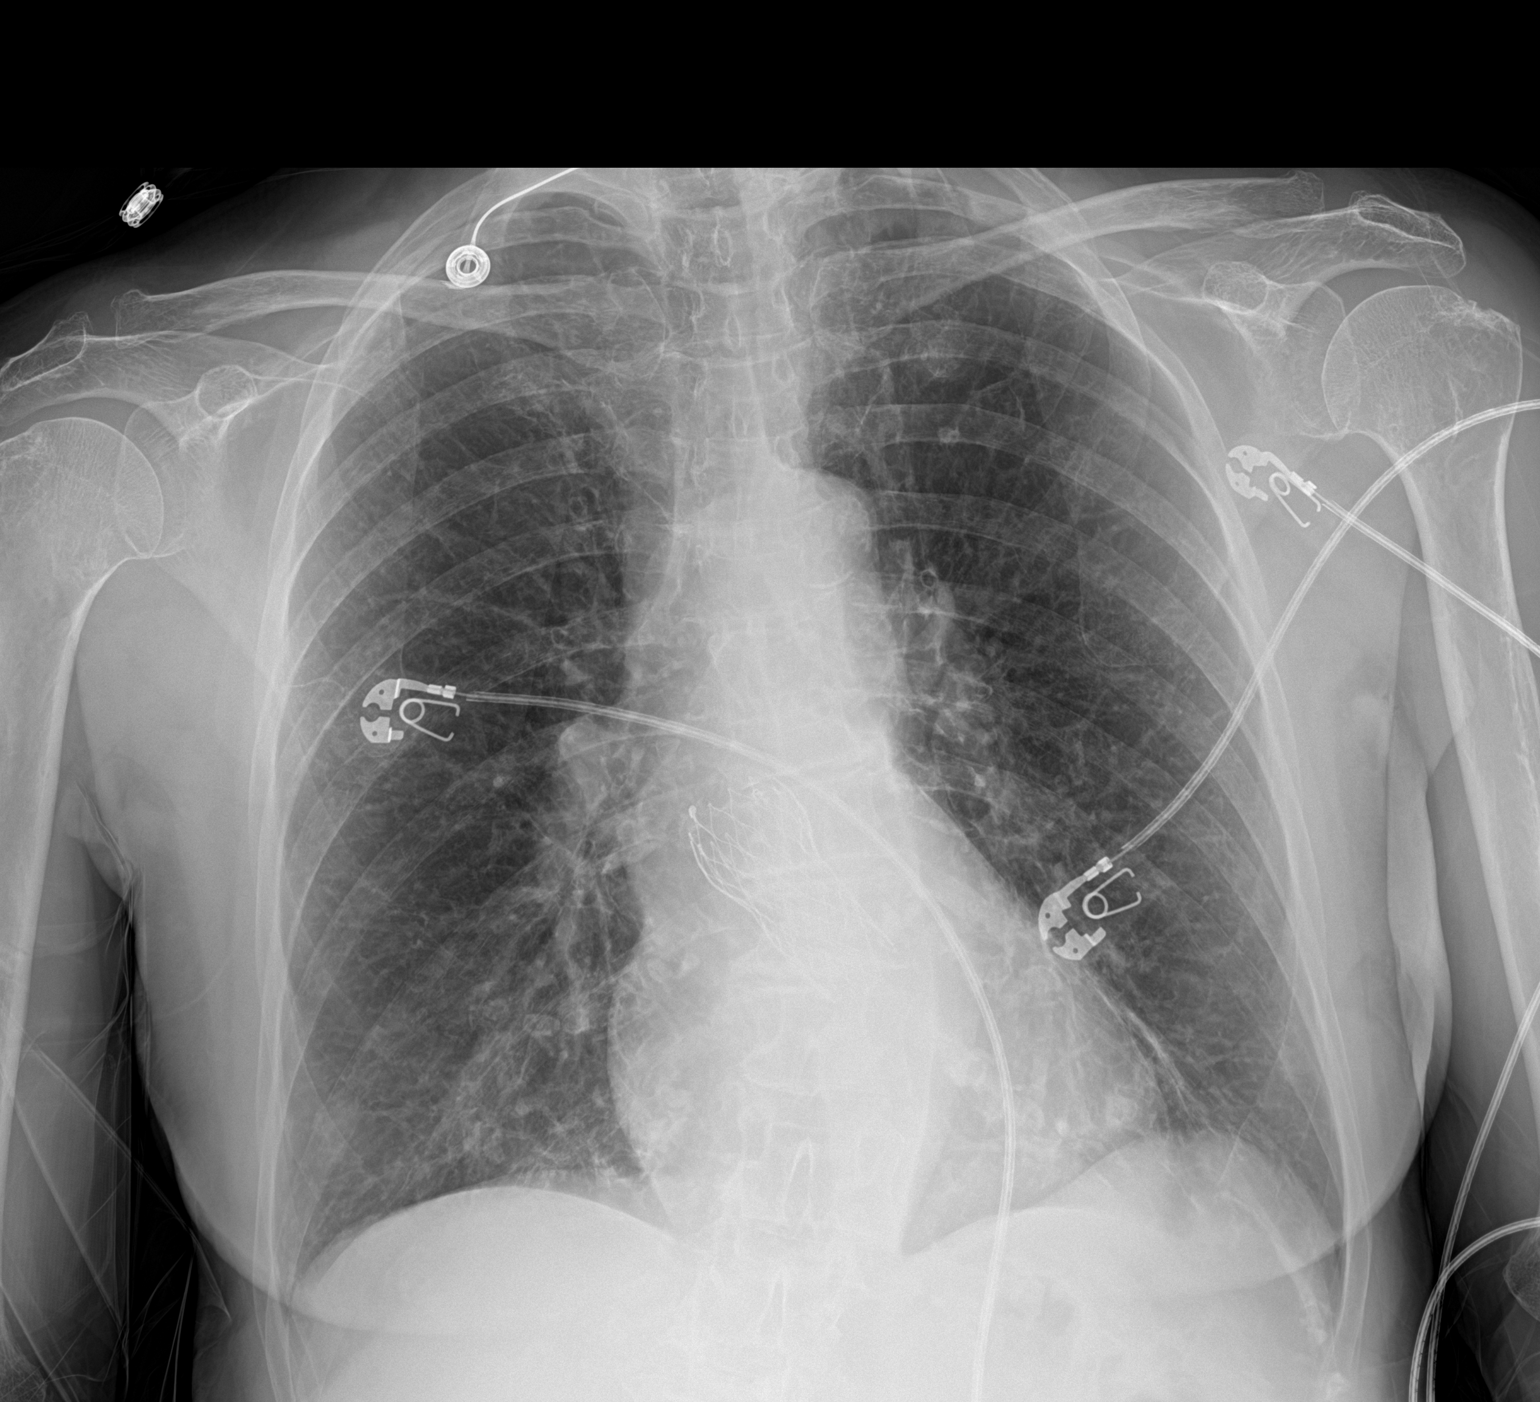

[1 of 1 positions shown; findings below may reference images not displayed]

FINDINGS: Bilateral interstitial thickening. No pleural effusion or
pneumothorax. Stable cardiomediastinal silhouette. Interval TAVR. No
acute osseous abnormality.
IMPRESSION: Interval TAVR.  No acute cardiopulmonary disease.

## 2021-05-23 ENCOUNTER — Ambulatory Visit (HOSPITAL_COMMUNITY): Payer: Medicare Other | Attending: Cardiology

## 2021-05-23 ENCOUNTER — Other Ambulatory Visit: Payer: Self-pay

## 2021-05-23 DIAGNOSIS — Z952 Presence of prosthetic heart valve: Secondary | ICD-10-CM

## 2021-05-23 DIAGNOSIS — I050 Rheumatic mitral stenosis: Secondary | ICD-10-CM | POA: Diagnosis present

## 2021-05-23 LAB — ECHOCARDIOGRAM COMPLETE
AR max vel: 1.22 cm2
AV Area VTI: 1.31 cm2
AV Area mean vel: 1.24 cm2
AV Mean grad: 10 mmHg
AV Peak grad: 18.9 mmHg
Ao pk vel: 2.18 m/s
Area-P 1/2: 2.69 cm2
S' Lateral: 2.1 cm

## 2021-06-11 HISTORY — PX: TRANSTHORACIC ECHOCARDIOGRAM: SHX275

## 2021-08-19 ENCOUNTER — Other Ambulatory Visit: Payer: Self-pay | Admitting: Family

## 2021-08-19 DIAGNOSIS — Z1231 Encounter for screening mammogram for malignant neoplasm of breast: Secondary | ICD-10-CM

## 2021-10-03 ENCOUNTER — Ambulatory Visit
Admission: RE | Admit: 2021-10-03 | Discharge: 2021-10-03 | Disposition: A | Discharge status: Home / Self Care | Payer: Medicare Other | Source: Ambulatory Visit | Attending: Family | Admitting: Family

## 2021-10-03 DIAGNOSIS — Z1231 Encounter for screening mammogram for malignant neoplasm of breast: Secondary | ICD-10-CM

## 2021-12-11 ENCOUNTER — Encounter: Payer: Self-pay | Admitting: Cardiology

## 2021-12-11 ENCOUNTER — Ambulatory Visit: Payer: Medicare Other | Admitting: Cardiology

## 2021-12-11 VITALS — BP 138/86 | HR 68 | Ht 59.0 in | Wt 116.6 lb

## 2021-12-11 DIAGNOSIS — Z952 Presence of prosthetic heart valve: Secondary | ICD-10-CM | POA: Diagnosis not present

## 2021-12-11 DIAGNOSIS — E785 Hyperlipidemia, unspecified: Secondary | ICD-10-CM | POA: Diagnosis not present

## 2021-12-11 DIAGNOSIS — I10 Essential (primary) hypertension: Secondary | ICD-10-CM

## 2021-12-11 DIAGNOSIS — I050 Rheumatic mitral stenosis: Secondary | ICD-10-CM

## 2021-12-11 MED ORDER — ROSUVASTATIN CALCIUM 10 MG PO TABS: 10.0000 mg | ORAL_TABLET | Freq: Every day | ORAL | 3 refills | Status: DC

## 2021-12-11 NOTE — Progress Notes (Signed)
Primary Care Provider: [name], NP Cardiologist: Crystal Hew, MD Electrophysiologist: None  Clinic Note: Chief Complaint  Patient presents with   Follow-up    Annual    Cardiac Valve Problem    S/p TAVR; Mitral Stenosis   ===================================  ASSESSMENT/PLAN   Problem List Items Addressed This Visit       Cardiology Problems   Hyperlipidemia LDL goal <100 (Chronic)    Interestingly, her lipids went the wrong way from 2021->2022.  As of September her LDL was 138. Would like to shoot for an LDL less than 100.  She is agreeable to attempt to restart statin.    Plan: Start rosuvastatin 10 mg daily.  Recommend she takes it at nighttime.  Anticipate labs to be checked in about September which would be 6 months or starting.  This to be a good time to see if there is any effect.        Relevant Medications   rosuvastatin (CRESTOR) 10 MG tablet   Other Relevant Orders   EKG 12-Lead (Completed)   Hypertension (Chronic)    Still pretty much and goal blood pressure for her age.  Upper limit of normal by criteria, but would not want to be overly aggressive.  At this time I think we should simply hold off on any antihypertensive management.      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  Continue to monitor.  No active symptoms.  Continue annual evaluation for now.  There may come a time when she is less concerned about follow-up in this case we can simply just hold off.  Discussed concerning symptoms.   > Recheck echo in December.      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  She gets her echocardiogram done in December-January timeframe.  We are following the TAVR valve as well as the mitral valve. No significant symptoms besides some lightheadedness and dizziness with rapid exertion.  Discussed SBE prophylaxis. => PRN amoxicillin prescription in place. Continue aspirin 81 mg daily.      Relevant Orders   EKG 12-Lead (Completed)   ECHOCARDIOGRAM COMPLETE   ===================================  HPI:    Crystal Rose is a 76 y.o. female with a PMH notable for h/o s/p TAVR (26 mm CorValve December 2020) with moderate MS below who presents today for ANNUAL FOLLOW-UP at the request of Crystal, Sakuma, NP.  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  Occasional orthostatic dizziness standing up too fast.  She had made and had to adjust her diet-cutting out sweets and unnecessary carbohydrates.  Also increasing level of activity doing yard work, gardening, but also walking in the neighborhood.  Recent Hospitalizations: none  Reviewed  CV studies:    The following studies were reviewed today: (if available, images/films reviewed: From Epic Chart or Care Everywhere) TTE 05/23/2021:  A 67m Evolut  Pro TAVR Bioprosthetic Aortic Valve is present.  Appears well-seated with normal function.  MG 515mHg, Vmax 2.2, DI 0.5. There is trivial paravalvular leak.  EF 60-65%.  No RWMA.  Mild concentric LVH.  GR 1 DD.  Normal RV-RSP and RAP.  Moderate calcific mitral stenosis (MVA 1.4 cm-mean gradient 6 mmHg at HR 66 bpm.)  Mild MR, mild to moderate TR. Comparison(s): Compared to prior TTE on 05/2020, there is no significant  change. Previous mean AoV gradient 185mg. Previous MVA 1.44cm2, mean  gradient 15m78m.   Interval History:   Crystal Rose here today for annual follow-up pretty much doing well.  She still has some mild exertional dyspnea with her routine activities, but nothing more than usual.  She still does her yard work, mowMarketing executiveShe enjoys weeding and gardening.  By the only time she notes any unusual if she tries to do something too hard or too fast.  She may get dizzy with some irregular heartbeat sensations.  She actually has been little more worried about the palpitations of late and went to decaf coffee and has cut out caffeine and tea and sodas.  In doing so the palpitations have notably improved.  CV Review of Symptoms (Summary) Cardiovascular ROS: no chest pain or dyspnea on exertion positive for - irregular heartbeat and these improved with drinking Decaf Coffee - funny b/c she never drank coffee or caffeine before negative for - edema, orthopnea, palpitations, paroxysmal nocturnal dyspnea, rapid heart rate, shortness of breath, or syncope/ near syncope; TIA/amaurosis fugax, claudication  REVIEWED OF SYSTEMS   Review of Systems  Constitutional:  Negative for malaise/fatigue and weight loss.  HENT:  Negative for congestion and nosebleeds.        Sneezing from allergies  Eyes:  Negative for blurred vision.  Respiratory:  Positive for cough (allergies). Negative for shortness of breath and wheezing.   Gastrointestinal:  Negative for abdominal pain, blood in stool, diarrhea and melena.  Genitourinary:  Negative for hematuria.  Musculoskeletal:  Positive for back pain (still issues from prior injury). Negative for falls and myalgias.  Neurological:  Negative for dizziness, focal weakness, weakness and headaches.  Endo/Heme/Allergies:  Positive for environmental allergies.  Psychiatric/Behavioral:  Negative for depression and memory loss. The patient is not nervous/anxious.   All other systems reviewed and are negative.   I have reviewed and (if needed) personally updated the  patient's problem list, medications, allergies, past medical and surgical history, social and family history.   PAST MEDICAL HISTORY   Past Medical History:  Diagnosis Date   GERD (gastroesophageal reflux disease)    H/O foot surgery    Hypertension    Osteoporosis, post-menopausal    RBBB    S/P TAVR (transcatheter aortic valve replacement)    Severe aortic stenosis 04/06/2019   [facility]: Severe aortic stenosis.  (Peak velocity 4.1 m/sec, mean gradient 40.0 mmHg, peak gradient 69 mmHg).     PAST SURGICAL HISTORY   Past Surgical History:  Procedure Laterality Date   CORONARY ANGIOGRAPHY N/A 04/29/2019   Procedure: CORONARY ANGIOGRAPHY;  Surgeon: HarLeonie ManD;  Location: [facility] [place];  Service: Cardiovascular;  Laterality: N/A;   INTRAOPERATIVE TRANSTHORACIC ECHOCARDIOGRAM N/A 06/07/2019   Procedure: TRANSTHORACIC ECHOCARDIOGRAM (TEE);  Surgeon: McABurnell BlanksD;  [facility];  Laterality: N/A;   RIGHT HEART CATH N/A 04/29/2019   Procedure: RIGHT HEART CATH;  Surgeon: HarEllyn Hack  Leonie Green, MD;  Location: [place] [facility];  Service: Cardiovascular;  Laterality: N/A;   TRANSCATHETER AORTIC VALVE REPLACEMENT, TRANSFEMORAL N/A 06/07/2019   Procedure: TRANSCATHETER AORTIC VALVE REPLACEMENT, TRANSFEMORAL;  Surgeon: Burnell Blanks, MD;  Location: [facility];  Service: Open Heart Surgery;  RFA Access - 26- CoreValve Evolut Pro TAVR valve   TRANSTHORACIC ECHOCARDIOGRAM  02/16/2019   2D Echo [facility]) : Normal LV size and function.  EF 60 to 65%.  Normal RV pressures.  GR 1 DD.  Severe aortic stenosis.  (Peak velocity 4.1 m/sec, mean gradient 40.0 mmHg, peak gradient 69 mmHg).    TRANSTHORACIC ECHOCARDIOGRAM  12/15/'20; 1/13/'21   Day 1Postop Echo: AVA increased to 1.79 cm.  No paravalvular leak.  Valve well-positioned.  EF 65-70%.  No LVH.  Normal atrial sizes.  Moderate MAC.--> F/u Echo 07/06/19: EF 60 to 65%.   Suggested grade 2 diastolic function?  But with normal atrial size (unlikely grade 2 diastolic dysfunction).  AoV Mean gradient 9 mmHg, peak 13 mmHg.   TRANSTHORACIC ECHOCARDIOGRAM  05/24/2020   1 year post TAVR: 26 mm CoreValve Evolut Pro T HV in aortic position.  No PVL.  Normal position.  No significant stenosis.  Moderate calcific mitral stenosis with mean gradient 7.2 mmHg at 70 bpm.  Degenerative mitral valve with mild and moderate MS (similar to prior study).  EF 65 to 70%.  No R WMA.   TRANSTHORACIC ECHOCARDIOGRAM  06/11/2021   26m Evolut Pro TAVR Bioprosthetic Aortic Valve present.  Well-seated w/ nL function.  MG 120mg, Vmax 2.2, DI 0.5. Trivial pvl; leak.  EF 60-65%.  No RWMA.  Mild concentric LVH.  GR 1 DD.  Normal RV-RSP and RAP.  Moderate calcific mitral stenosis (MVA 1.4 cm-mean gradient 6 mmHg at HR 66 bpm.)  Mild MR, mild to moderate TR. => no notable change from 06/11/2020    Immunization History  Administered Date(s) Administered   Moderna Sars-Covid-2 Vaccination 09/09/2019, 10/07/2019    MEDICATIONS/ALLERGIES   Current Meds  Medication Sig   acetaminophen (TYLENOL) 650 MG CR tablet Take 1,300 mg by mouth every 8 (eight) hours as needed for pain.    amoxicillin (AMOXIL) 500 MG tablet Take 4 pills (2000 mg) 1 hour before your dental cleanings/procedures   Ascorbic Acid (VITAMIN C) 1000 MG tablet Take 1,000 mg by mouth daily.   aspirin 81 MG EC tablet Take 81 mg by mouth daily.    B Complex Vitamins (VITAMIN B COMPLEX) TABS Take 1 tablet by mouth daily.    Calcium Carbonate-Vit D-Min (CALCIUM 1200 PO) Take 1 tablet by mouth 2 (two) times daily.    cholecalciferol (VITAMIN D3) 25 MCG (1000 UT) tablet Take 1,000 Units by mouth daily.   CINNAMON PO Take 1,000 mg by mouth 2 (two) times daily.    levocetirizine (XYZAL) 5 MG tablet Take 5 mg by mouth every evening.    Magnesium 250 MG TABS Take 250 mg by mouth daily.   multivitamin-lutein (OCUVITE-LUTEIN) CAPS capsule Take 1  capsule by mouth daily.    Allergies  Allergen Reactions   5-Alpha Reductase Inhibitors    Augmentin [Amoxicillin-Pot Clavulanate] Diarrhea    SOCIAL HISTORY/FAMILY HISTORY   Reviewed in Epic:  Pertinent findings:  Social History   Tobacco Use   Smoking status: Never   Smokeless tobacco: Never  Vaping Use   Vaping Use: Never used  Substance Use Topics   Alcohol use: Not Currently   Drug use: Never   Social History  Social History Narrative   She is a retired Education administrator.  Has 1 daughter that she lives with in [place].      Does not exercise regularly, but does yard work and gardening.   She lives an active lifestyle and tends to have a relatively healthy diet.      Her daughter is a grade school teacher, and Thomasa has been making masks for her daughter to wear at school.    OBJCTIVE -PE, EKG, labs   Wt Readings from Last 3 Encounters:  12/11/21 116 lb 9.6 oz (52.9 kg)  12/10/20 123 lb 3.2 oz (55.9 kg)  05/24/20 119 lb 12.8 oz (54.3 kg)    Physical Exam: BP 138/86   Pulse 68   Ht _0  (1.499 m)   Wt 116 lb 9.6 oz (52.9 kg)   SpO2 98%   BMI 23.55 kg/m  Physical Exam Vitals reviewed.  Constitutional:      General: She is not in acute distress.    Appearance: Normal appearance. She is normal weight. She is not ill-appearing or toxic-appearing.  HENT:     Head: Normocephalic and atraumatic.  Neck:     Vascular: No carotid bruit.  Cardiovascular:     Rate and Rhythm: Normal rate and regular rhythm. Occasional Extrasystoles are present.    Chest Wall: PMI is not displaced.     Pulses: Normal pulses and intact distal pulses.     Heart sounds: S1 normal and S2 normal. Murmur heard.     Harsh crescendo-decrescendo early systolic murmur is present with a grade of 2/6 at the upper right sternal border radiating to the neck.     Low-pitched rumbling crescendo middiastolic murmur is present with a grade of 1/4 at the apex.     No friction rub. No  gallop.  Pulmonary:     Effort: Pulmonary effort is normal. No respiratory distress.     Breath sounds: Normal breath sounds. No wheezing, rhonchi or rales.  Chest:     Chest wall: No tenderness.  Musculoskeletal:        General: No swelling. Normal range of motion.     Cervical back: Normal range of motion and neck supple.  Skin:    General: Skin is warm and dry.     Coloration: Skin is not jaundiced.  Neurological:     General: No focal deficit present.     Mental Status: She is alert and oriented to person, place, and time.     Motor: No weakness.     Gait: Gait normal.  Psychiatric:        Mood and Affect: Mood normal.        Behavior: Behavior normal.        Thought Content: Thought content normal.        Judgment: Judgment normal.     Adult ECG Report  Rate: 68 ;  Rhythm: normal sinus rhythm and normal axis, intervals & durations ;   Narrative Interpretation: stable  Recent Labs:   [facility] Lipid Panel Component 03/01/21 02/15/20  Triglycerides 105 70  Cholesterol 246 High  218 High   HDL 87 103  LDL Calculated 138 High   101 High     03/01/2021 Ref Range & Units 9 mo ago  Sodium 135 - 145 mmol/L 136   Potassium 3.5 - 5.0 mmol/L 4.3   Chloride 98 - 107 mmol/L 103   CO2 22.0 - 32.0 mmol/L 26.0   Anion  Gap 7 - 15 mmol/L 7   BUN 7 - 21 mg/dL 23 High    Creatinine 0.60 - 1.00 mg/dL 0.80   BUN/Creatinine Ratio  29   eGFR CKD-EPI (2021) Female >=60 mL/min/1.38m 76   Glucose 74 - 106 mg/dL 117 High    Calcium 8.5 - 10.2 mg/dL 9.6   Albumin 3.4 - 5.0 g/dL 4.4   Total Protein 6.5 - 8.3 g/dL 6.9   Total Bilirubin 0.1 - 1.2 mg/dL 0.6   AST 14 - 38 U/L 37   ALT <35 U/L 25   Alkaline Phosphatase 38 - 126 U/L 73     Ref Range & Units 9 mo ago  WBC 3.6 - 11.2 10*9/L 4.7   RBC 3.95 - 5.13 10*12/L 3.95   HGB 11.3 - 14.9 g/dL 13.0   HCT 34.0 - 44.0 % 37.7   MCV 77.6 - 95.7 fL 95.6   MCH 25.9 - 32.4 pg 32.9 High    MCHC 32.0 - 36.0 g/dL 34.4    RDW 12.2 - 15.2 % 13.0   MPV 6.8 - 10.7 fL 7.6   Platelet 150 - 450 10*9/L 299    ==================================================  COVID-19 Education: The signs and symptoms of COVID-19 were discussed with the patient and how to seek care for testing (follow up with PCP or arrange E-visit).    I spent a total of 28 minutes with the patient spent in direct patient consultation.  Additional time spent with chart review  / charting (studies, outside notes, etc): 14 min Total Time: 42 min  Current medicines are reviewed at length with the patient today.  (+/- concerns) none  This visit occurred during the SARS-CoV-2 public health emergency.  Safety protocols were in place, including screening questions prior to the visit, additional usage of staff PPE, and extensive cleaning of exam room while observing appropriate contact time as indicated for disinfecting solutions.  Notice: This dictation was prepared with Dragon dictation along with smart phrase technology. Any transcriptional errors that result from this process are unintentional and may not be corrected upon review.  Studies Ordered:   Orders Placed This Encounter  Procedures   EKG 12-Lead   ECHOCARDIOGRAM COMPLETE   Meds ordered this encounter  Medications   rosuvastatin (CRESTOR) 10 MG tablet    Sig: Take 1 tablet (10 mg total) by mouth daily.    Dispense:  90 tablet    Refill:  3    Patient Instructions / Medication Changes & Studies & Tests Ordered   Patient Instructions  Medication Instructions:   Start taking  Rosuvastatin 10 mg daily  at bedtime   *If you need a refill on your cardiac medications before your next appointment, please call your pharmacy*   Lab Work: Not needed     Testing/Procedures: Will be schedule at [address] -/[place] that you have an echocardiogram. Echocardiography is a painless test that uses sound waves to create images of  your heart. It provides your doctor with information about the size and shape of your heart and how well your heart's chambers and valves are working. This procedure takes approximately one hour. There are no restrictions for this procedure.    Follow-Up: [facility] you and your health needs are our priority.  As part of our continuing mission to provide you with exceptional heart care, we have created designated Provider Care Teams.  These Care Teams include your primary Cardiologist (physician)  and Advanced Practice Providers (APPs -  Physician Assistants and Nurse Practitioners) who all work together to provide you with the care you need, when you need it.     Your next appointment:   12 month(s)  The format for your next appointment:   In Person  Provider:   [name], MD       Crystal Rose, M.D., M.S. Interventional Cardiologist   Pager # [phone] Phone # [phone] [address] [place], [place] [address]   Thank you for choosing Heartcare at [facility]!!

## 2021-12-11 NOTE — Patient Instructions (Addendum)
Medication Instructions:   Start taking  Rosuvastatin 10 mg daily  at bedtime   *If you need a refill on your cardiac medications before your next appointment, please call your pharmacy*   Lab Work: Not needed     Testing/Procedures: Will be schedule at [address] -/DEC 2023 Your physician has requested that you have an echocardiogram. Echocardiography is a painless test that uses sound waves to create images of your heart. It provides your doctor with information about the size and shape of your heart and how well your heart's chambers and valves are working. This procedure takes approximately one hour. There are no restrictions for this procedure.    Follow-Up: At [facility], you and your health needs are our priority.  As part of our continuing mission to provide you with exceptional heart care, we have created designated Provider Care Teams.  These Care Teams include your primary Cardiologist (physician) and Advanced Practice Providers (APPs -  Physician Assistants and Nurse Practitioners) who all work together to provide you with the care you need, when you need it.     Your next appointment:   12 month(s)  The format for your next appointment:   In Person  Provider:   Bryan Lemma, MD

## 2021-12-28 ENCOUNTER — Encounter: Payer: Self-pay | Admitting: Cardiology

## 2021-12-28 NOTE — Assessment & Plan Note (Signed)
TAVR valve working well on most recent echocardiogram.  She gets her echocardiogram done in December-January timeframe.  We are following the TAVR valve as well as the mitral valve. No significant symptoms besides some lightheadedness and dizziness with rapid exertion.  Discussed SBE prophylaxis. => PRN amoxicillin prescription in place. Continue aspirin 81 mg daily.

## 2021-12-28 NOTE — Assessment & Plan Note (Signed)
Mitral valve seems stable compared to last check.  Continue to monitor.  No active symptoms.  Continue annual evaluation for now.  There may come a time when she is less concerned about follow-up in this case we can simply just hold off.  Discussed concerning symptoms.   > Recheck echo in December.

## 2021-12-28 NOTE — Assessment & Plan Note (Signed)
Still pretty much and goal blood pressure for her age.  Upper limit of normal by criteria, but would not want to be overly aggressive.  At this time I think we should simply hold off on any antihypertensive management.

## 2021-12-28 NOTE — Assessment & Plan Note (Signed)
Interestingly, her lipids went the wrong way from 2021->2022.  As of September her LDL was 138. Would like to shoot for an LDL less than 100.  She is agreeable to attempt to restart statin.    Plan: Start rosuvastatin 10 mg daily.  Recommend she takes it at nighttime.  Anticipate labs to be checked in about September which would be 6 months or starting.  This to be a good time to see if there is any effect.

## 2022-05-26 ENCOUNTER — Ambulatory Visit (HOSPITAL_COMMUNITY): Payer: Medicare Other | Attending: Cardiology

## 2022-05-26 DIAGNOSIS — Z952 Presence of prosthetic heart valve: Secondary | ICD-10-CM | POA: Insufficient documentation

## 2022-05-26 DIAGNOSIS — I050 Rheumatic mitral stenosis: Secondary | ICD-10-CM | POA: Insufficient documentation

## 2022-05-26 DIAGNOSIS — I10 Essential (primary) hypertension: Secondary | ICD-10-CM | POA: Insufficient documentation

## 2022-05-26 LAB — ECHOCARDIOGRAM COMPLETE
AV Mean grad: 13 mmHg
AV Peak grad: 21.1 mmHg
Ao pk vel: 2.3 m/s
Area-P 1/2: 1.55 cm2
S' Lateral: 1.8 cm

## 2022-05-27 ENCOUNTER — Other Ambulatory Visit (HOSPITAL_COMMUNITY): Payer: Medicare Other

## 2022-08-21 ENCOUNTER — Other Ambulatory Visit: Payer: Self-pay | Admitting: Family

## 2022-08-21 DIAGNOSIS — Z1231 Encounter for screening mammogram for malignant neoplasm of breast: Secondary | ICD-10-CM

## 2022-10-06 ENCOUNTER — Ambulatory Visit
Admission: RE | Admit: 2022-10-06 | Discharge: 2022-10-06 | Disposition: A | Discharge status: Home / Self Care | Payer: Medicare Other | Source: Ambulatory Visit | Attending: Family | Admitting: Family

## 2022-10-06 DIAGNOSIS — Z1231 Encounter for screening mammogram for malignant neoplasm of breast: Secondary | ICD-10-CM

## 2023-01-11 NOTE — Progress Notes (Unsigned)
Cardiology Office Note:  .   Date:  01/13/2023  ID:  Crystal Rose, DOB 1946/02/26, MRN [id number] PCP: Crystal Anger, Crystal Rose  [facility] Providers Cardiologist:  Bryan Lemma, MD     Chief Complaint  Patient presents with   Follow-up    Annual follow-up after TAVR    History of Present Illness: .     Crystal Rose is a 78 y.o. female with a PMH notable forTAVR (26 mm CorValve December 2020) for Severe AS w/ Mod MS (mean gradient 7.2 mmHg) below who presents today for ANNUAL FOLLOW-UP Crystal Rose, Crystal Dike, Crystal Rose.  Crystal Rose was last seen on 12/11/2021 for annual follow-up.  She was doing very well.  This was 6 months after her most recent post TAVR echo.  She is doing well.  Mild exertional dyspnea with routine activities but nothing unusual.  Still doing yard work-mowing the lawn, weed eating and gardening.  She may get dizzy or feels a lightheadedness or dyspnea if she overdid it.  But otherwise not routine activities.  She has cut down her caffeine intake because concerns of palpitations and that helped a lot.  Was dealing with some environmental allergies and back pain from prior injury.     Subjective  INTERVAL HISTORY Crystal Rose returns here today for annual follow-up doing remarkably well.  No major cardiac symptoms.  She is active walking and exercising.  She enjoys walking in the neighborhood with her husband and prior to her TAVR would have a hard time going up and down hills, but now is able to go up and down hills without any difficulty.  No dyspnea, chest pain at either rest or exertion.  She has fully cut up caffeine, and denies any rapid irregular heartbeats or palpitations.  She has not had any heart failure symptoms of PND, orthopnea or edema.  Thankfully, she has not had to have any procedures done where she is to take PRN antibiotics.  No stroke type symptoms.  No syncope or near syncope. She was not sure if she still needed to  take the atorvastatin, therefore stopped taking it.  He is only taking the aspirin and her ramipril.  Cardiovascular ROS: no chest pain or dyspnea on exertion negative for - edema, irregular heartbeat, orthopnea, palpitations, paroxysmal nocturnal dyspnea, rapid heart rate, shortness of breath, or lightheadedness, dizziness or wooziness, syncope/near syncope or TIA/versus fugax, claudication  ROS:   Review of Systems - Negative except mild bruising from aspirin.     Objective   Studies Reviewed: Marland Kitchen   EKG Interpretation Date/Time:  Tuesday January 13 2023 13:24:28 EDT Ventricular Rate:  63 PR Interval:  192 QRS Duration:  110 QT Interval:  442 QTC Calculation: 452 R Axis:   -62  Text Interpretation: Normal sinus rhythm Left axis deviation Incomplete left bundle branch block Minimal voltage criteria for LVH, may be normal variant ( Cornell product ) When compared with ECG of 08-Jun-2019 05:11, Incomplete left bundle branch block has replaced Incomplete right bundle branch block Whem compared ot EKG 11/2021 - R waves in precordial leads less prominent.  Otherwise no significant change Confirmed by Bryan Lemma ([id number]) on 01/13/2023 1:40:00 PM   ECHO 05/26/2022: Normal LV size and function.  EF 60 to 65%.  No RWMA.  Unable assess D FXN due to severe MAC.  Normal RV size and function.  Replaced AoV with 26 mm CoreVavle-Evolut Pro TAVR bioprosthesis.  Normal structure and function.  Mean AVG 13  mmHg.  Trivial paravalvular leak.  Degenerative MV with mild MR and mild calcific MS.  Mean MV G 6 mmH at 65 bpm.  Severe MAC.  Normal RAP.  Suggestion of atrial level shunting detected by color-flow Doppler.   Recent Labs: from [facility] - due for labs reckeck in Sept.  [facility] 09/17/22 03/06/22  Sodium 132 Low  136  Potassium 4.1 4.6  Chloride 100 101  CO2 28.0 25.0  Anion Gap 4 Low  10  BUN 17 23 High   Creatinine 0.60 0.70  BUN/Creatinine Ratio 28 33  eGFR CKD-EPI (2021) Female >90  89    Glucose 93 102  Calcium 9.3 9.6   Lipid Panen 03/06/22 03/01/21  Triglycerides 77 105  Cholesterol 233 High  246 High   HDL 106 87  LDL Calculated 112 High   782 High    FASTING Yes Yes   CBC   WBC 4.7  HGB 13.8  HCT 39.7  Platelet 303   Risk Assessment/Calculations:             Physical Exam:   VS:  BP 130/62 (BP Location: Left Arm, Patient Position: Sitting, Cuff Size: Normal)   Pulse 63   Ht 4\' 10"  (1.473 m)   Wt 112 lb 6.4 oz (51 kg)   SpO2 98%   BMI 23.49 kg/m    Wt Readings from Last 3 Encounters:  01/13/23 112 lb 6.4 oz (51 kg)  12/11/21 116 lb 9.6 oz (52.9 kg)  12/10/20 123 lb 3.2 oz (55.9 kg)    GEN: Well nourished, well groomed in no acute distress; healthy-appearing. NECK: No JVD; No carotid bruits CARDIAC: Normal S1, S2; RRR with occasional ectopy, harsh 2/6 SEM at RUSB and low pitched 1/4 diastolic murmur heard at the apex probably what is wrong with the.  Otherwise no rubs, gallops RESPIRATORY:  Clear to auscultation without rales, wheezing or rhonchi ; nonlabored, good air movement. ABDOMEN: Soft, non-tender, non-distended EXTREMITIES:  No edema; No deformity     ASSESSMENT AND PLAN: .    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  My thought initially benefit that we could delay her follow-up echocardiogram, but we do need to continue to monitor the mitral valve.  Plan: Recheck echo in December.      Relevant Medications   ramipril (ALTACE) 1.25 MG capsule   rosuvastatin (CRESTOR) 10 MG tablet (Start on 01/14/2023)   Other Relevant Orders   EKG 12-Lead (Completed)   ECHOCARDIOGRAM COMPLETE   Hypertension (Chronic)    Well-controlled BP on minimal dose of ACE inhibitor.      Relevant Medications   ramipril (ALTACE) 1.25 MG capsule   rosuvastatin (CRESTOR) 10 MG tablet (Start  on 01/14/2023)   Other Relevant Orders   EKG 12-Lead (Completed)   Hyperlipidemia LDL goal <100 (Chronic)    Due for follow-up labs in about a month or 2.  She has not been taking her statin so I recommend that she does take rosuvastatin maybe to try at least 3 days a week.  Would like to see LDL less than 100.  Last check was 112.      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  Remains stable.  Plan: Recheck echo in December 2023 She still has a amoxicillin for SBE prophylaxis.      Relevant Orders   EKG 12-Lead (Completed)   ECHOCARDIOGRAM COMPLETE            Dispo: Return in about 1 year (around 01/13/2024).  Total time spent: 19 min spent with patient + 18 min spent charting = 37 min    Signed, Marykay Lex, MD, MS Bryan Lemma, M.D., M.S. Interventional Cardiologist  [facility]  Pager # [phone] Phone # [phone] [address] [place], [place] [address]

## 2023-01-13 ENCOUNTER — Ambulatory Visit: Payer: Medicare Other | Attending: Cardiology | Admitting: Cardiology

## 2023-01-13 ENCOUNTER — Encounter: Payer: Self-pay | Admitting: Cardiology

## 2023-01-13 VITALS — BP 130/62 | HR 63 | Ht <= 58 in | Wt 112.4 lb

## 2023-01-13 DIAGNOSIS — I050 Rheumatic mitral stenosis: Secondary | ICD-10-CM

## 2023-01-13 DIAGNOSIS — E785 Hyperlipidemia, unspecified: Secondary | ICD-10-CM | POA: Diagnosis not present

## 2023-01-13 DIAGNOSIS — Z952 Presence of prosthetic heart valve: Secondary | ICD-10-CM | POA: Diagnosis not present

## 2023-01-13 DIAGNOSIS — I10 Essential (primary) hypertension: Secondary | ICD-10-CM | POA: Diagnosis not present

## 2023-01-13 MED ORDER — ROSUVASTATIN CALCIUM 10 MG PO TABS: 10.0000 mg | ORAL_TABLET | ORAL | 3 refills | Status: DC

## 2023-01-13 NOTE — Assessment & Plan Note (Signed)
She has a murmur and well-functioning valve.  Remains stable.  Plan: Recheck echo in December 2023 She still has a amoxicillin for SBE prophylaxis.

## 2023-01-13 NOTE — Assessment & Plan Note (Signed)
She does have a diastolic murmur on exam along with aortic stenosis murmur.  My thought initially benefit that we could delay her follow-up echocardiogram, but we do need to continue to monitor the mitral valve.  Plan: Recheck echo in December.

## 2023-01-13 NOTE — Assessment & Plan Note (Signed)
Due for follow-up labs in about a month or 2.  She has not been taking her statin so I recommend that she does take rosuvastatin maybe to try at least 3 days a week.  Would like to see LDL less than 100.  Last check was 112.

## 2023-01-13 NOTE — Patient Instructions (Addendum)
Medication Instructions:     Start taking Rosuvastatin 10 mg on Monday - Wednesday Fridays  *If you need a refill on your cardiac medications before your next appointment, please call your pharmacy*   Lab Work: Not needed    Testing/Procedures:  Schedule in Dec 2024 at [address] Your physician has requested that you have an echocardiogram. Echocardiography is a painless test that uses sound waves to create images of your heart. It provides your doctor with information about the size and shape of your heart and how well your heart's chambers and valves are working. This procedure takes approximately one hour. There are no restrictions for this procedure. Please do NOT wear cologne, perfume, aftershave, or lotions (deodorant is allowed). Please arrive 15 minutes prior to your appointment time.    Follow-Up: At [facility], you and your health needs are our priority.  As part of our continuing mission to provide you with exceptional heart care, we have created designated Provider Care Teams.  These Care Teams include your primary Cardiologist (physician) and Advanced Practice Providers (APPs -  Physician Assistants and Nurse Practitioners) who all work together to provide you with the care you need, when you need it.     Your next appointment:   12 month(s)  The format for your next appointment:   In Person  Provider:   Bryan Lemma, MD

## 2023-01-13 NOTE — Assessment & Plan Note (Signed)
Well-controlled BP on minimal dose of ACE inhibitor.

## 2023-03-25 ENCOUNTER — Other Ambulatory Visit: Payer: Self-pay | Admitting: Family

## 2023-03-25 DIAGNOSIS — Z780 Asymptomatic menopausal state: Secondary | ICD-10-CM

## 2023-05-25 HISTORY — PX: PACEMAKER IMPLANT: EP1218

## 2023-05-25 HISTORY — PX: TRANSTHORACIC ECHOCARDIOGRAM: SHX275

## 2023-05-26 ENCOUNTER — Telehealth: Payer: Self-pay | Admitting: Cardiology

## 2023-05-26 DIAGNOSIS — I442 Atrioventricular block, complete: Secondary | ICD-10-CM | POA: Insufficient documentation

## 2023-05-26 NOTE — Telephone Encounter (Signed)
FYI Doctor wanted Dr Herbie Baltimore to know that Pt is being discharged today and had pacemaker put in for complete heart block. He states pt is doing well.

## 2023-05-27 ENCOUNTER — Other Ambulatory Visit (HOSPITAL_COMMUNITY): Payer: Medicare Other

## 2023-06-03 ENCOUNTER — Ambulatory Visit (HOSPITAL_COMMUNITY): Payer: Medicare Other

## 2023-06-05 NOTE — Progress Notes (Signed)
Cardiology Office Note:  .   Date:  06/12/2023  ID:  Vaughan Basta, DOB Feb 09, 1946, MRN [id number] PCP: Marcell Anger, NP  [facility] Providers Cardiologist:  Dr. Herbie Baltimore  History of Present Illness: .   Crystal Rose is a 77 y.o. female with history of moderate mitral valve stenosis, hypertension, hyperlipidemia, status post TAVR in December 2020.  Last seen by Dr. Herbie Baltimore in the office on 01/13/2023.  She was stable from a cardiac standpoint.  She will need a repeat echocardiogram for her moderate mitral valve stenosis surveillance.  No changes were made on her medication regimen she was continued on statin therapy.  It was noted that she would need to continue to have SBE prophylaxis from TAVR.  Since being seen last the patient was admitted to [facility] in the setting of weakness and found to be in complete heart block.  She lives in [place] city and became diaphoretic and near syncopal.  Her daughter called EMS and she was taken directly to [facility].  On 05/26/2023 she had implantation of a:  [employer]  Model: Azure XT DR MRI [id number]  Type of Device: Dual Chamber Pacemaker  Indication: Sinus node dysfunction  Implanting MD: Lucianne Muss   The patient states that she will be following with Dr. Lucianne Muss, for pacemaker interrogation and EP needs.  While hospitalized the patient was found to have a rash on her right leg.  She was tested for [facility] spotted fever and was found to be positive.  She was placed on doxycycline treatment.  Since that time she has had some generalized fatigue after completing course.  It was noted during hospitalization and follow-up that she did have significant anxiety and has been treated for this with Klonopin.  She has been very sedentary since returning home from her hospitalization.  During hospitalization she also had an echocardiogram completed on 05/25/2023:  Echocardiogram  1. The left ventricular systolic function is normal, LVEF  is visually  estimated at > 55%.    2. The right ventricle is normal in size, with normal systolic function.    3. There is a 26 mm CoreValve TAVR aortic valve present. Implant Date:  05/2019.   4. Aortic valve Doppler indices are consistent with normal prosthetic valve  function.   5. There is moderate to severe mitral stenosis.    6. There is mild mitral valve regurgitation.    7. There is mild pulmonic regurgitation.     ROS: As above otherwise negative.  Studies Reviewed: .      Echocardiogram as above  Physical Exam:   VS:  BP 116/60 (BP Location: Right Arm, Patient Position: Sitting, Cuff Size: Normal)   Pulse 88   Ht 4\' 10"  (1.473 m)   Wt 119 lb 6.4 oz (54.2 kg)   SpO2 94%   BMI 24.95 kg/m    Wt Readings from Last 3 Encounters:  06/12/23 119 lb 6.4 oz (54.2 kg)  01/13/23 112 lb 6.4 oz (51 kg)  12/11/21 116 lb 9.6 oz (52.9 kg)    GEN: Well nourished, well developed in no acute distress NECK: No JVD; No carotid bruits CARDIAC: RRR, 2/6 systolic murmur heard best at the left sternal border murmurs, rubs, gallops/pacemaker implantation incision high left chest.  Well-healed without signs of infection. RESPIRATORY:  Clear to auscultation without rales, wheezing or rhonchi  ABDOMEN: Soft, non-tender, non-distended EXTREMITIES:  No edema; No deformity   ASSESSMENT AND PLAN: .    Complete  heart block: Status post dual-chamber pacemaker for Dr. Lucianne Muss at [facility].  She will be following him for EP needs and pacemaker follow-up.   2.  History of TAVR: Surgery December 2020.  Echocardiogram as above showing normally functioning aortic prosthetic valve.  She will need SBE prophylaxis.  3.  Moderate to severe mitral valve stenosis: She denies any significant dyspnea on exertion.  She does have some generalized fatigue after hospitalization and [facility] spotted fever diagnoses.  She will follow-up with Dr. Herbie Baltimore and have serial echocardiograms to follow the mitral valve.  May  need to consider surgical intervention if she does become symptomatic.  4.  Hypertension: Blood pressure is very well-controlled on current medication regimen.  Continue ramipril 1.25 mg daily.  5.  Hypercholesterolemia: On rosuvastatin 10 mg on Mondays and Wednesdays.  Follow-up lipids and LFTs should be completed on next office visit if not completed by primary care.  6.  [place] spotted fever: Diagnosed during recent hospitalization for complete heart block.  Has finished course of doxycycline.  She does have some leftover malaise associated.         Signed, Bettey Mare. Liborio Nixon, ANP, AACC

## 2023-06-12 ENCOUNTER — Ambulatory Visit: Payer: Medicare Other | Attending: Adult Health | Admitting: Adult Health

## 2023-06-12 ENCOUNTER — Encounter: Payer: Self-pay | Admitting: Adult Health

## 2023-06-12 VITALS — BP 116/60 | HR 88 | Ht <= 58 in | Wt 119.4 lb

## 2023-06-12 DIAGNOSIS — I442 Atrioventricular block, complete: Secondary | ICD-10-CM

## 2023-06-12 DIAGNOSIS — I10 Essential (primary) hypertension: Secondary | ICD-10-CM | POA: Diagnosis not present

## 2023-06-12 DIAGNOSIS — E785 Hyperlipidemia, unspecified: Secondary | ICD-10-CM

## 2023-06-12 DIAGNOSIS — Z950 Presence of cardiac pacemaker: Secondary | ICD-10-CM | POA: Insufficient documentation

## 2023-06-12 DIAGNOSIS — Z952 Presence of prosthetic heart valve: Secondary | ICD-10-CM | POA: Diagnosis not present

## 2023-06-12 DIAGNOSIS — I050 Rheumatic mitral stenosis: Secondary | ICD-10-CM

## 2023-06-12 DIAGNOSIS — Z8619 Personal history of other infectious and parasitic diseases: Secondary | ICD-10-CM

## 2023-06-12 HISTORY — DX: Personal history of other infectious and parasitic diseases: Z86.19

## 2023-06-12 NOTE — Patient Instructions (Signed)
Medication Instructions:  No Changes *If you need a refill on your cardiac medications before your next appointment, please call your pharmacy*   Lab Work: No labs If you have labs (blood work) drawn today and your tests are completely normal, you will receive your results only by: MyChart Message (if you have MyChart) OR A paper copy in the mail If you have any lab test that is abnormal or we need to change your treatment, we will call you to review the results.   Testing/Procedures: No Testing   Follow-Up: At [facility], you and your health needs are our priority.  As part of our continuing mission to provide you with exceptional heart care, we have created designated Provider Care Teams.  These Care Teams include your primary Cardiologist (physician) and Advanced Practice Providers (APPs -  Physician Assistants and Nurse Practitioners) who all work together to provide you with the care you need, when you need it.  We recommend signing up for the patient portal called "MyChart".  Sign up information is provided on this After Visit Summary.  MyChart is used to connect with patients for Virtual Visits (Telemedicine).  Patients are able to view lab/test results, encounter notes, upcoming appointments, etc.  Non-urgent messages can be sent to your provider as well.   To learn more about what you can do with MyChart, go to [email or web address].    Your next appointment:   4-6 month(s)  Provider:   Bryan Lemma, MD

## 2023-06-30 ENCOUNTER — Telehealth: Payer: Self-pay | Admitting: Cardiology

## 2023-06-30 DIAGNOSIS — I509 Heart failure, unspecified: Secondary | ICD-10-CM

## 2023-06-30 NOTE — Telephone Encounter (Signed)
 Called and spoke to patients daughter. Patient's daughter report patient was admitted to [facility] again on 12/30 for pneumonia and fluid on her lungs. Her daughter stated at discharge she was told to follow up with Dr Anner and repeat ECHO that was done during her hospital stay. She would like ECHO ordered prior to her appt with Albino Beauvais, NP on 1/27 at 3:10 pm. Please advise.

## 2023-06-30 NOTE — Telephone Encounter (Signed)
 Pt's daughter is requesting a callback regarding having another put in for pt to have an ECHO. Please advise

## 2023-07-01 NOTE — Telephone Encounter (Signed)
 Emelia Josefa HERO, NP  Cv Div Nl Triage; Anner Alm ORN, MD; Gladis Reena GAILS, RN22 hours ago (11:44 AM)    I will defer recommendations to Dr. Anner.  I have not seen this patient previously.  Her upcoming appointment on the 27th will be her initial appointment with me.  Thank you.   Noted  Will await update from Dr. Anner

## 2023-07-04 NOTE — Telephone Encounter (Signed)
 I think it makes sense to have the echo ordered prior to the visit with Verdon Cummins.  Echo can be ordered with a diagnosis of congestive heart failure.'   Bryan Lemma, MD

## 2023-07-10 NOTE — Telephone Encounter (Signed)
Called spoke to patient 's daughter. Graci Hulce's aware will be scheduling echo  Verbalized understanding   echo order placed.

## 2023-07-14 ENCOUNTER — Ambulatory Visit (HOSPITAL_COMMUNITY): Payer: Medicare Other | Attending: Cardiology

## 2023-07-14 DIAGNOSIS — I509 Heart failure, unspecified: Secondary | ICD-10-CM | POA: Insufficient documentation

## 2023-07-14 HISTORY — PX: TRANSTHORACIC ECHOCARDIOGRAM: SHX275

## 2023-07-14 LAB — ECHOCARDIOGRAM COMPLETE
AR max vel: 1.55 cm2
AV Area VTI: 1.6 cm2
AV Area mean vel: 1.61 cm2
AV Mean grad: 13 mm[Hg]
AV Peak grad: 24.3 mm[Hg]
Ao pk vel: 2.46 m/s
Area-P 1/2: 2.52 cm2
S' Lateral: 2.2 cm

## 2023-07-15 ENCOUNTER — Encounter: Payer: Self-pay | Admitting: Cardiology

## 2023-07-17 NOTE — Progress Notes (Unsigned)
Cardiology Clinic Note   Patient Name: Crystal Rose Date of Encounter: 07/20/2023  Primary Care Provider:  Marcell Anger, NP Primary Cardiologist:  Bryan Lemma, MD  Patient Profile    Crystal Rose 62 female presents to the clinic today for follow-up evaluation posthospitalization at [facility].  She was admitted on 06/22/2023 and discharged on 06/26/2023.  She was admitted with acute hypoxic respiratory failure.  Past Medical History    Past Medical History:  Diagnosis Date   GERD (gastroesophageal reflux disease)    H/O foot surgery    H/O [facility] spotted fever 06/12/2023   Hypertension    Osteoporosis, post-menopausal    RBBB    S/P TAVR (transcatheter aortic valve replacement)    Severe aortic stenosis 04/06/2019   2D Echo [facility]) 02/16/2019: Severe aortic stenosis.  (Peak velocity 4.1 m/sec, mean gradient 40.0 mmHg, peak gradient 69 mmHg).    Past Surgical History:  Procedure Laterality Date   CORONARY ANGIOGRAPHY N/A 04/29/2019   Procedure: CORONARY ANGIOGRAPHY;  Surgeon: Marykay Lex, MD;  Location: [facility] INVASIVE CV LAB;  Service: Cardiovascular;  Laterality: N/A;   INTRAOPERATIVE TRANSTHORACIC ECHOCARDIOGRAM N/A 06/07/2019   Procedure: TRANSTHORACIC ECHOCARDIOGRAM (TEE);  Surgeon: Kathleene Hazel, MD;  Location: [facility] OR;  Service: Open Heart Surgery;  Laterality: N/A;   RIGHT HEART CATH N/A 04/29/2019   Procedure: RIGHT HEART CATH;  Surgeon: Marykay Lex, MD;  Location: [facility] INVASIVE CV LAB;  Service: Cardiovascular;  Laterality: N/A;   TRANSCATHETER AORTIC VALVE REPLACEMENT, TRANSFEMORAL N/A 06/07/2019   Procedure: TRANSCATHETER AORTIC VALVE REPLACEMENT, TRANSFEMORAL;  Surgeon: Kathleene Hazel, MD;  Location: [facility] OR;  Service: Open Heart Surgery;  RFA Access - 26- CoreValve Evolut Pro TAVR valve   TRANSTHORACIC ECHOCARDIOGRAM  02/16/2019   2D Echo [facility]) : Normal LV size and function.  EF 60 to 65%.  Normal RV  pressures.  GR 1 DD.  Severe aortic stenosis.  (Peak velocity 4.1 m/sec, mean gradient 40.0 mmHg, peak gradient 69 mmHg).    TRANSTHORACIC ECHOCARDIOGRAM  12/15/'20; 1/13/'21   Day 1Postop Echo: AVA increased to 1.79 cm.  No paravalvular leak.  Valve well-positioned.  EF 65-70%.  No LVH.  Normal atrial sizes.  Moderate MAC.--> F/u Echo 07/06/19: EF 60 to 65%.  Suggested grade 2 diastolic function?  But with normal atrial size (unlikely grade 2 diastolic dysfunction).  AoV Mean gradient 9 mmHg, peak 13 mmHg.   TRANSTHORACIC ECHOCARDIOGRAM  05/24/2020   1 year post TAVR: 26 mm CoreValve Evolut Pro T HV in aortic position.  No PVL.  Normal position.  No significant stenosis.  Moderate calcific mitral stenosis with mean gradient 7.2 mmHg at 70 bpm.  Degenerative mitral valve with mild and moderate MS (similar to prior study).  EF 65 to 70%.  No R WMA.   TRANSTHORACIC ECHOCARDIOGRAM  06/11/2021   26mm Evolut Pro TAVR Bioprosthetic Aortic Valve present.  Well-seated w/ nL function.  MG , Vmax 2.2, DI 0.5. Trivial pvl; leak.  EF 60-65%.  No RWMA.  Mild concentric LVH.  GR 1 DD.  Normal RV-RSP and RAP.  Moderate calcific mitral stenosis (MVA 1.4 cm-mean gradient 6 mmHg at HR 66 bpm.)  Mild MR, mild to moderate TR. => no notable change from 06/11/2020    Allergies  Allergies  Allergen Reactions   5-Alpha Reductase Inhibitors    Augmentin [Amoxicillin-Pot Clavulanate] Diarrhea    History of Present Illness    Crystal Rose has  a PMH of acute hypoxic respiratory failure, GERD, hypertension, aortic valve stenosis status post TAVR, anxiety, status post PPI, mitral valve regurgitation and mitral valve stenosis, hyperlipidemia, and [place] spotted fever.  She underwent TAVR on 12/20.  She was seen in follow-up by Dr. Herbie Baltimore 7/24.  She was stable from a cardiac standpoint.  She was in need of follow-up echocardiogram for monitoring of mitral valve stenosis.  Her medical therapy was  continued.  The need for continued SBE prophylaxis post TAVR was also reviewed.  She was seen in follow-up by Joni Reining, NP on 06/12/2023.  At that time she reported that she had been admitted to [facility] in the setting weakness.  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  She lives in [place].  She became diaphoretic and syncopal.  Daughters called EMS.  She was taken to [facility].  On 05/26/2023 she received PPM, Medtronic dual-chamber pacemaker that was implanted by Dr. Lucianne Muss.  During admission she was found to have rash on her right leg.  She was tested for [facility] spotted fever.  She was positive.  She received doxycycline.  She noted some generalized fatigue after completing her course of therapy.  During hospitalization she was noted to have significant anxiety.  She was being treated with Klonopin.  Her blood pressure was noted to be 116/60.  Her pulse was 88.  She was again admitted to [facility] on 06/22/2023 and discharged on 06/26/2023.  She was admitted with acute hypoxic respiratory failure.  She presents to the clinic today for follow-up evaluation and states she feels that her breathing and physical activity is getting better.  We reviewed her recent hospitalization.  She reports that the only reason she went there was because they did not ask her when she was being picked up by the ambulance.  She has been taking Lasix daily.  Her pulse is slightly elevated today at 101.  Her weight has returned to normal.  I have asked her to take her Lasix as needed.  She also had her ramipril placed on hold.  Her blood pressure today is 130/66.  We will continue to hold her ramipril.  We will plan follow-up as scheduled.  Today she denies chest pain, increase shortness of breath, lower extremity edema, fatigue, palpitations, melena, hematuria, hemoptysis, diaphoresis, weakness, presyncope, syncope, orthopnea, and PND.    Home Medications    Prior to Admission medications   Medication  Sig Start Date End Date Taking? Authorizing Provider  acetaminophen (TYLENOL) 650 MG CR tablet Take 1,300 mg by mouth every 8 (eight) hours as needed for pain.     [provider]  albuterol (VENTOLIN HFA) 108 (90 Base) MCG/ACT inhaler Inhale into the lungs every 4 (four) hours as needed. 05/23/23   [provider]  amoxicillin (AMOXIL) 500 MG tablet Take 4 pills (2000 mg) 1 hour before your dental cleanings/procedures 06/15/19   Janetta Hora, PA-C  Ascorbic Acid (VITAMIN C) 1000 MG tablet Take 1,000 mg by mouth daily.    [provider]  aspirin 81 MG EC tablet Take 81 mg by mouth daily.  05/18/12   [provider]  B Complex Vitamins (VITAMIN B COMPLEX) TABS Take 1 tablet by mouth daily.  05/18/12   [provider]  Calcium Carbonate-Vit D-Min (CALCIUM 1200 PO) Take 1 tablet by mouth 2 (two) times daily.     [provider]  cholecalciferol (VITAMIN D3) 25 MCG (1000 UT) tablet Take 1,000  Units by mouth daily.    [provider]  escitalopram (LEXAPRO) 10 MG tablet Take 10 mg by mouth daily. 06/03/23 06/02/24  [provider]  levocetirizine (XYZAL) 5 MG tablet Take 5 mg by mouth every evening.     [provider]  Magnesium 250 MG TABS Take 250 mg by mouth daily.    [provider]  montelukast (SINGULAIR) 10 MG tablet Take 10 mg by mouth daily.    [provider]  ramipril (ALTACE) 1.25 MG capsule Take 1.25 mg by mouth daily.    [provider]  rosuvastatin (CRESTOR) 10 MG tablet Take 1 tablet (10 mg total) by mouth every Monday, Wednesday, and Friday at 8 PM. 01/14/23   Marykay Lex, MD    Family History    Family History  Problem Relation Age of Onset   Breast cancer Mother 75   Heart attack Mother 32   Dementia Sister        Sadly, otherwise healthy   Alcoholism Brother        Has lost social contact with family   Drug abuse Brother    She indicated that her mother  is deceased. She indicated that her sister is alive. She indicated that her brother is alive.  Social History    Social History   Socioeconomic History   Marital status: Widowed    Spouse name: Not on file   Number of children: 1   Years of education: Not on file   Highest education level: Associate degree: occupational, Scientist, product/process development, or vocational program  Occupational History   Occupation: Engineer, production  Tobacco Use   Smoking status: Never   Smokeless tobacco: Never  Vaping Use   Vaping status: Never Used  Substance and Sexual Activity   Alcohol use: Not Currently   Drug use: Never   Sexual activity: Not on file  Other Topics Concern   Not on file  Social History Narrative   She is a retired Pharmacologist.  Has 1 daughter that she lives with in [place].      Does not exercise regularly, but does yard work and gardening.   She lives an active lifestyle and tends to have a relatively healthy diet.      Her daughter is a grade school teacher, and Dacy has been making masks for her daughter to wear at school.   Social Drivers of Corporate investment banker Strain: Low Risk  (05/25/2023)   Received from [facility]   Overall Financial Resource Strain (CARDIA)    Difficulty of Paying Living Expenses: Not hard at all  Food Insecurity: No Food Insecurity (05/25/2023)   Received from [facility]   Hunger Vital Sign    Worried About Running Out of Food in the Last Year: Never true    Ran Out of Food in the Last Year: Never true  Transportation Needs: No Transportation Needs (05/25/2023)   Received from [facility] - Transportation    Lack of Transportation (Medical): No    Lack of Transportation (Non-Medical): No  Physical Activity: Not on file  Stress: Not on file  Social Connections: Not on file  Intimate Partner Violence: Not on file     Review of Systems    General:  No chills, fever, night sweats or weight changes.   Cardiovascular:  No chest pain, dyspnea on exertion, edema, orthopnea, palpitations, paroxysmal nocturnal dyspnea. Dermatological: No rash, lesions/masses Respiratory: No cough,  dyspnea Urologic: No hematuria, dysuria Abdominal:   No nausea, vomiting, diarrhea, bright red blood per rectum, melena, or hematemesis Neurologic:  No visual changes, wkns, changes in mental status. All other systems reviewed and are otherwise negative except as noted above.  Physical Exam    VS:  BP 130/66 (BP Location: Left Arm, Patient Position: Sitting)   Pulse 98   Ht 4\' 10"  (1.473 m)   Wt 112 lb 6.4 oz (51 kg)   SpO2 92%   BMI 23.49 kg/m  , BMI Body mass index is 23.49 kg/m. GEN: Well nourished, well developed, in no acute distress. HEENT: normal. Neck: Supple, no JVD, carotid bruits, or masses. Cardiac: RRR, no murmurs, rubs, or gallops. No clubbing, cyanosis, edema.  Radials/DP/PT 2+ and equal bilaterally.  Respiratory:  Respirations regular and unlabored, clear to auscultation bilaterally. GI: Soft, nontender, nondistended, BS + x 4. MS: no deformity or atrophy. Skin: warm and dry, no rash. Neuro:  Strength and sensation are intact. Psych: Normal affect.  Accessory Clinical Findings    Recent Labs: No results found for requested labs within last 365 days.   Recent Lipid Panel No results found for: "CHOL", "TRIG", "HDL", "CHOLHDL", "VLDL", "LDLCALC", "LDLDIRECT"       ECG personally reviewed by me today- EKG Interpretation Date/Time:  Monday July 20 2023 15:09:31 EST Ventricular Rate:  101 PR Interval:  196 QRS Duration:  122 QT Interval:  376 QTC Calculation: 487 R Axis:   -32  Text Interpretation: Sinus tachycardia with Fusion complexes Possible Left atrial enlargement Left axis deviation Left bundle branch block When compared with ECG of 13-Jan-2023 13:24, Fusion complexes are now Present Vent. rate has increased BY  38 BPM Left bundle branch block has replaced Incomplete left  bundle branch block Confirmed by Edd Fabian [phone]) on 07/20/2023 3:22:24 PM    Echocardiogram 07/14/2023  IMPRESSIONS     1. Left ventricular ejection fraction, by estimation, is 60 to 65%. The  left ventricle has normal function. The left ventricle has no regional  wall motion abnormalities. There is severe asymmetric left ventricular  hypertrophy of the septal segment. Left   ventricular diastolic function could not be evaluated.   2. Right ventricular systolic function is normal. The right ventricular  size is normal.   3. Left atrial size was moderately dilated.   4. The mitral valve is degenerative. Moderate mitral valve regurgitation.  Moderate to severe mitral stenosis. The mean mitral valve gradient is 11.0  mmHg. Severe mitral annular calcification.   5. Tricuspid valve regurgitation is moderate to severe.   6. The aortic valve has been repaired/replaced. Aortic valve  regurgitation is not visualized. No aortic stenosis is present. There is a  26 mm CoreValve-Evolut Pro prosthetic (TAVR) valve present in the aortic  position. Aortic valve mean gradient  measures 13.0 mmHg. Aortic valve Vmax measures 2.46 m/s.   7. The inferior vena cava is normal in size with greater than 50%  respiratory variability, suggesting right atrial pressure of 3 mmHg.   FINDINGS   Left Ventricle: Left ventricular ejection fraction, by estimation, is 60  to 65%. The left ventricle has normal function. The left ventricle has no  regional wall motion abnormalities. The left ventricular internal cavity  size was normal in size. There is   severe asymmetric left ventricular hypertrophy of the septal segment.  Left ventricular diastolic function could not be evaluated due to mitral  annular calcification (moderate or greater). Left ventricular diastolic  function could  not be evaluated.  Indeterminate filling pressures.   Right Ventricle: The right ventricular size is normal. No increase in   right ventricular wall thickness. Right ventricular systolic function is  normal.   Left Atrium: Left atrial size was moderately dilated.   Right Atrium: Device noted. Right atrial size was normal in size.   Pericardium: There is no evidence of pericardial effusion. Presence of  epicardial fat layer.   Mitral Valve: The mitral valve is degenerative in appearance. Severe  mitral annular calcification. Moderate mitral valve regurgitation.  Moderate to severe mitral valve stenosis. MV peak gradient, 23.8 mmHg. The  mean mitral valve gradient is 11.0 mmHg.   Tricuspid Valve: The tricuspid valve is normal in structure. Tricuspid  valve regurgitation is moderate to severe. No evidence of tricuspid  stenosis.   Aortic Valve: The aortic valve has been repaired/replaced. Aortic valve  regurgitation is not visualized. No aortic stenosis is present. Aortic  valve mean gradient measures 13.0 mmHg. Aortic valve peak gradient  measures 24.3 mmHg. Aortic valve area, by  VTI measures 1.60 cm. There is a 26 mm CoreValve-Evolut Pro prosthetic,  stented (TAVR) valve present in the aortic position.   Pulmonic Valve: The pulmonic valve was not well visualized. Pulmonic valve  regurgitation is mild. No evidence of pulmonic stenosis.   Aorta: The aortic root is normal in size and structure.   Venous: The inferior vena cava is normal in size with greater than 50%  respiratory variability, suggesting right atrial pressure of 3 mmHg.   IAS/Shunts: No atrial level shunt detected by color flow Doppler.       Assessment & Plan   1.  Status post TAVR-no increased DOE or activity intolerance.  Recovering well posthospitalization.  Echocardiogram 07/14/2023 showed LVEF of 60 to 65%, severe LVH, moderately dilated left atria, moderate mitral valve regurgitation, moderate-severe mitral stenosis, repaired/replaced aortic valve with no aortic stenosis. Continue SBE prophylaxis Heart healthy low-sodium  diet Maintain physical activity  Moderate-severe mitral valve stenosis-continues to recover from recent hospitalization.  EF noted to be normal.  Discussed option of referring back to structural heart for possible mitral valve procedure.  She wishes to proceed with evaluation. Follow-up with structural heart for recommendations on mitral valve  Complete heart block-heart rate today 98 bpm.  Denies episodes of lightheadedness, presyncope or syncope.  Underwent placement of PPM 05/26/2023. Following with [facility] EP  Essential hypertension-BP today 130/66. Maintain blood pressure log Heart healthy low-sodium diet On hold ramipril  Hyperlipidemia- compliant with crestor Continue rosuvastatin High-fiber diet Increase physical activity as tolerated Repeat fasting lipids and LFTs  Disposition: Follow-up with Dr. Herbie Baltimore or me in 4 to 6 months.   Thomasene Ripple. Timberlyn Pickford NP-C     07/20/2023, 3:48 PM [facility] [address] Office [phone] Fax [phone]    I spent 14 minutes examining this patient, reviewing medications, and using patient centered shared decision making involving their cardiac care.   I spent greater than 20 minutes reviewing their past medical history,  medications, and prior cardiac tests.

## 2023-07-20 ENCOUNTER — Encounter: Payer: Self-pay | Admitting: General Practice

## 2023-07-20 ENCOUNTER — Ambulatory Visit: Payer: Medicare Other | Attending: General Practice | Admitting: General Practice

## 2023-07-20 VITALS — BP 130/66 | HR 98 | Ht <= 58 in | Wt 112.4 lb

## 2023-07-20 DIAGNOSIS — Z952 Presence of prosthetic heart valve: Secondary | ICD-10-CM | POA: Diagnosis not present

## 2023-07-20 DIAGNOSIS — E785 Hyperlipidemia, unspecified: Secondary | ICD-10-CM

## 2023-07-20 DIAGNOSIS — I10 Essential (primary) hypertension: Secondary | ICD-10-CM | POA: Diagnosis not present

## 2023-07-20 DIAGNOSIS — I442 Atrioventricular block, complete: Secondary | ICD-10-CM

## 2023-07-20 DIAGNOSIS — I350 Nonrheumatic aortic (valve) stenosis: Secondary | ICD-10-CM

## 2023-07-20 DIAGNOSIS — I050 Rheumatic mitral stenosis: Secondary | ICD-10-CM

## 2023-07-20 NOTE — Patient Instructions (Addendum)
Medication Instructions:  TAKE YOUR FUROSEMIDE AS NEEDED-AS DISCUSSED *If you need a refill on your cardiac medications before your next appointment, please call your pharmacy*  Lab Work: NONE  Other Instructions CONTINUE DEEP BREATHING EXERCISES INCREASE PHYSICAL ACTIVITY-AS TOLERATED  Follow-Up: At [facility], you and your health needs are our priority.  As part of our continuing mission to provide you with exceptional heart care, we have created designated Provider Care Teams.  These Care Teams include your primary Cardiologist (physician) and Advanced Practice Providers (APPs -  Physician Assistants and Nurse Practitioners) who all work together to provide you with the care you need, when you need it.  Your next appointment:   AS SCHEDULED   Provider:   Bryan Lemma, MD

## 2023-08-31 ENCOUNTER — Telehealth: Payer: Self-pay | Admitting: Cardiology

## 2023-08-31 NOTE — Telephone Encounter (Signed)
 Patient c/o Palpitations:  STAT if patient reporting lightheadedness, shortness of breath, or chest pain  How long have you had palpitations/irregular HR/ Afib? Are you having the symptoms now?   A little  Are you currently experiencing lightheadedness, SOB or CP?   NO  Do you have a history of afib (atrial fibrillation) or irregular heart rhythm?   Yes  Have you checked your BP or HR? (document readings if available):   Not today   Friday, 3/7   115/77  HR 91  Are you experiencing any other symptoms?   No   Patient stated she has been having "fluttering in her heart" which started about a week ago.

## 2023-08-31 NOTE — Telephone Encounter (Signed)
 Spoke with pt, she reports a fluttering in her chest mainly at night but for the last week or so she has also noticed it during the day. She does not notice any racing, just fluttering. She has an appointment Wednesday this week with dr Lucianne Muss at [facility]. Right now her device is followed in [place] and would like to get that changed to [place]. Aware that should not be a problem. Spoke with dr Remus Blake office, the patient is scheduled for a device check and any abnormal rhythms will be seen at that time. Patient is currently taking eliquis.

## 2023-10-14 ENCOUNTER — Encounter: Payer: Self-pay | Admitting: Cardiology

## 2023-10-14 ENCOUNTER — Ambulatory Visit: Payer: Medicare Other | Admitting: Cardiology

## 2023-10-14 ENCOUNTER — Ambulatory Visit: Payer: Medicare Other | Attending: Cardiology | Admitting: Cardiology

## 2023-10-14 VITALS — BP 126/66 | HR 69 | Ht <= 58 in | Wt 108.0 lb

## 2023-10-14 DIAGNOSIS — D6869 Other thrombophilia: Secondary | ICD-10-CM

## 2023-10-14 DIAGNOSIS — E785 Hyperlipidemia, unspecified: Secondary | ICD-10-CM

## 2023-10-14 DIAGNOSIS — I442 Atrioventricular block, complete: Secondary | ICD-10-CM

## 2023-10-14 DIAGNOSIS — I050 Rheumatic mitral stenosis: Secondary | ICD-10-CM

## 2023-10-14 DIAGNOSIS — I5032 Chronic diastolic (congestive) heart failure: Secondary | ICD-10-CM | POA: Diagnosis not present

## 2023-10-14 DIAGNOSIS — I10 Essential (primary) hypertension: Secondary | ICD-10-CM

## 2023-10-14 DIAGNOSIS — Z952 Presence of prosthetic heart valve: Secondary | ICD-10-CM

## 2023-10-14 DIAGNOSIS — I480 Paroxysmal atrial fibrillation: Secondary | ICD-10-CM | POA: Diagnosis not present

## 2023-10-14 DIAGNOSIS — Z950 Presence of cardiac pacemaker: Secondary | ICD-10-CM

## 2023-10-14 DIAGNOSIS — I509 Heart failure, unspecified: Secondary | ICD-10-CM

## 2023-10-14 NOTE — Progress Notes (Unsigned)
 Cardiology Office Note:  .   Date:  10/16/2023  ID:  Crystal Rose, DOB Jun 09, 1946, MRN [id number] PCP: Acey Holding, NP  [facility] Providers Cardiologist:  Randene Bustard, MD     Chief Complaint  Patient presents with   Follow-up    63-month follow-up.  Getting stronger.  Exercising more.  Lots elevated that happens since I last saw her   Cardiac Valve Problem    History of TAVR but now with moderate to severe mitral stenosis   Atrial Fibrillation    Diagnosed in the setting of pneumonia and hypoxic respiratory failure also status post PPM placement for complete heart block    Patient Profile: .     Crystal Rose is a healthy appearing 78 y.o. female with a PMH reviewed below presents here for 65-month follow-up.   She returns today at the request her CP/referring provider Acey Holding, NP.  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 Hubert Madden)     Since her last visit, Crystal Rose has had several medical events:  Admitted to [facility] December 2-3, 2024 for complete heart block => PPM placement.  During this hospitalization she was noted to have a rash that was diagnosed with RMSF-treated with doxycycline.  Seen by Crystal Jetty, NP on 01/10/2023 following PPM placement => no major changes made.  [facility] ER June 21, 2023: Noted increasing dyspnea worse with exertion.  Unfortunately started having worsening fevers as well as increased dyspnea including orthopnea.  No chest pain or pressure.  Initially treated with 40 mg IV Lasix along with vancomycin  and cefepime.  Also noted to be in A-fib Transferred to [facility]: Transition to ceftriaxone and azithromycin along with spot dose of Lasix to improve volume status.  He was discharged on 20 mg  Lasix for volume control along.  Thoracentesis performed on January 1 with mixed transaminasemia effusion likely related to pneumonia and volume overload.  Complicated by mild hyponatremia. Started on Eliquis 2.5 mg twice daily for PAF although there was concern because of.  July 20, 2023 by Lawana Pray, NP:   She noted that her breathing was getting better.  Her ramipril was on hold.  She was taking Lasix daily.   Discussed option of referring back to structural heart for possible mitral valve procedure.  She wishes to proceed with evaluation. Follow-up with structural heart for recommendations on mitral valve.  Subjective  Discussed the use of AI scribe software for clinical note transcription with the patient, who gave verbal consent to proceed.  History of Present Illness History of Present Illness Crystal Rose is a 78 year old female with a history of TAVR and recent pacemaker placement who presents for follow-up of mitral valve issues and pacemaker management.  In December, she experienced a significant cardiac event where her heart stopped, leading to the placement of a Medtronic pacemaker. This event was preceded by symptoms of waking up soaking wet and a heart rate of twenty, prompting an ambulance call. At the same time, she was diagnosed with [facility] spotted fever, which may have contributed to her illness.  Following the pacemaker placement, she was hospitalized again at the end of December with pneumonia and fluid around her heart and lungs. She was initially treated at [facility] and then transferred to [facility] for further care. Since  then, she has been experiencing shortness of breath and difficulty bouncing back, which led to an ER visit.  She has a history of TAVR performed in 2020. Recent echocardiograms in December and January revealed moderate mitral valve regurgitation and moderate to severe mitral stenosis with a mean gradient of eleven. She has not been  experiencing significant symptoms such as chest pain, prolonged episodes of heart fluttering, or shortness of breath during daily activities, including walking.  Her current medications include Eliquis, which was started due to AFib during her hospitalization. She is not on ramipril or Lasix regularly. She monitors her blood pressure and weight. She reports no current use of ramipril after it was held during her hospital stay due to hypotension.  She has been walking daily without experiencing chest pain, shortness of breath, or heart racing. No episodes of passing out or waking up short of breath. She is not experiencing any prolonged episodes of heart fluttering, and any previous fluttering was transient and resolved within a week.     Objective    Studies Reviewed: Crystal Rose   EKG Interpretation Date/Time:  Wednesday October 14 2023 16:09:07 EDT Ventricular Rate:  69 PR Interval:  186 QRS Duration:  124 QT Interval:  444 QTC Calculation: 475 R Axis:   -61  Text Interpretation: Normal sinus rhythm Possible Left atrial enlargement Left axis deviation Non-specific intra-ventricular conduction delay / Left bundle branch block When compared with ECG of 20-Jul-2023 15:09, Fusion complexes are no longer Present T wave amplitude has increased in Inferior leads T wave inversion less evident in Lateral leads Confirmed by Randene Bustard ([id number]) on 10/14/2023 4:41:22 PM    [facility] Related to Basic metabolic panel Component 08/21/23 07/17/23 06/26/23 06/25/23 06/24/23 06/23/23  Sodium 133 Low  126 Low  131 Low  132 Low  134 Low  135  Potassium 4.8 4.4 3.7 4 4.2 3.4  Chloride 96 Low  89 Low  92 Low  94 Low  94 Low  92 Low   CO2 27 27 30  32 High  30 35 High   Anion Gap 10 10 9 6 10 8   BUN 14 12 11 14 13 16   Creatinine 0.7 0.5 Low  0.46 Low  0.54 Low  0.52 Low  0.62  BUN/Creatinine Ratio 20 24 24 26 25 26   eGFR CKD-EPI (2021) Female 88  >90  >90  >90  >90  >90   Glucose 104 111 High  106 112 114  131  Calcium  9.8 8.9 8.8 8.6 Low  8.9 9.1   Component 08/21/23 07/17/23 06/26/23 06/25/23 06/24/23 06/23/23  WBC 5.8 7.7 9.6 8.5 9.4 8.8  HGB 12.1 10.1 Low  9.8 Low  9 Low  9.2 Low  9.9 Low   HCT 35.6 29.8 Low  28.1 Low  25.8 Low  26.3 Low  28.3 Low   Platelet 338 570 High  592 High  545 High  565 High  647 High    Component 03/11/23 03/06/22  Triglycerides 55 77  Cholesterol 193 233 High   HDL 117 106  LDL Calculated 65  112 High     Echo 06/2023 ([facility]): EF 60 to 65%.  No RWMA.  Severe asymmetric LVH of septal wall.  Moderate LA dilation.  Generative Mitral Valve with moderate MR and moderate to severe MS (mean MVD 11 mL of mercury) moderate to severe TR.  26 mm CoreValve-Evolut Pro prosthetic (TAVR) valve present in the aortic position => no AI or AS, mean AVG  13 mmHg.  Normal RAP.  ECHO ([facility]-06/23/2023): Normal EF greater than 55%.  Moderate MR.  26 mm Medtronic valve stable.  Mild to moderate TR.  Small to moderate size pericardial effusion.  Evidence of elevated RAP, but no evidence of tamponade physiology Echo [facility] 05/25/2023: EF> 55%. 26 mm CoreValve TAVR aortic valve present. Normal function.  Moderate-severe mitral stenosis with mild MR (mean diastolic gradient 10 mmHg at 86 bpm).  Mild PR.  Medtronic PPM 05/26/2023 - PPM @ [facility] => Medtronic Model: Azure XT DR MRI [id number] (Dr. Hubert Madden) CTA Cardiac 06/23/2023: Tip of the atrial lead terminates 203 mm beyond the internal margin of the right atrium and may represent microperforation.  RV lead terminates in the RV.  Small pericardial effusion with moderate bilateral pleural effusions and pulm edema noted.   Risk Assessment/Calculations:    CHA2DS2-VASc Score = 5   This indicates a 7.2% annual risk of stroke. The patient's score is based upon: CHF History: 1 HTN History: 0 Diabetes History: 0 Stroke History: 0 Vascular Disease History: 1 Age Score: 2 Gender Score: 1   Started on Eliquis      Physical Exam:   VS:  BP 126/66    Pulse 69   Ht 4\' 10"  (1.473 m)   Wt 108 lb (49 kg)   SpO2 97%   BMI 22.57 kg/m    Wt Readings from Last 3 Encounters:  10/14/23 108 lb (49 kg)  07/20/23 112 lb 6.4 oz (51 kg)  06/12/23 119 lb 6.4 oz (54.2 kg)    GEN: Well nourished, well groomed in no acute distress; healthy-appearing. NECK: No JVD; No carotid bruits CARDIAC: RRR with normal S1 and S2.  Harsh 2/6 SEM at RUSB-neck, cannot auscultate diastolic murmur RESPIRATORY:  Clear to auscultation without rales, wheezing or rhonchi ; nonlabored, good air movement. ABDOMEN: Soft, non-tender, non-distended EXTREMITIES:  No edema; No deformity     ASSESSMENT AND PLAN: .    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  Careful with BP management given advanced age and concerns for hypotension.  PRN furosemide that she is rarely using.  I suspect that she became symptomatic in the setting of her pneumonia as well as A-fib RVR.  Certainly if she were to have another episode of A-fib RVR she could easily go back into heart failure, and would want to consider AAD therapy.      Heart block AV complete (HCC) (Chronic)   Pacemaker placed in December 2024.      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  Elevated CHA2DS2-VASc score warrants continued therapy. - Would be okay to hold Eliquis if necessary for procedures or surgeries (2 to 3 days depending on the procedure)      Hyperlipidemia LDL goal <100 (Chronic)   Last labs were September 2024 and LDL was 65 on 10 mg rosuvastatin .  - Continue rosuvastatin  10 mg.  Labs followed in PCP.      Relevant Orders   EKG 12-Lead (Completed)   ECHOCARDIOGRAM COMPLETE    Hypertension (Chronic)   She had only been on very low-dose ramipril but with her current blood pressures and recent hospitalizations we will hold off on restarting  for now.  We may need rate control if she has recurrence of A-fib.      Moderate mitral stenosis by prior echocardiography - Primary (Chronic)   Moderate to severe stenosis with mean gradient of 11.  Pretty much asymptomatic, no heart failure or dyspnea.  Watchful waiting advised due to surgical complexity and lack of symptoms.   I am concerned however with her episode of A-fib while in the hospital for pneumonia that this could potentially exacerbate her symptoms. - Order follow-up echocardiogram in July. - Consult with Dr. Sherene Dilling (CVTS) regarding echocardiogram findings and potential intervention. - Consider referral to mitral valve surgeon if necessary. - Monitor for symptoms such as dyspnea or heart failure.      Relevant Orders   ECHOCARDIOGRAM COMPLETE   PAF (paroxysmal atrial fibrillation) (HCC) (Chronic)   Brief episode of A-fib while hospitalized for pneumonia.  With valvular heart disease she certainly has a substrate for A-fib with some left atrial dilation as well.  Thankfully, her underlying rate has been well-controlled and she is not on any rate control agents at baseline.  Will continue to hold off on any rate control agents for now and simply continue with coverage with Eliquis for stroke prophylaxis.  Hopefully with PPM interrogation we can determine if she is having episodes of A-fib that she is unaware of. -Continue Eliquis 2.5 mg twice daily      Relevant Orders   ECHOCARDIOGRAM COMPLETE   Ambulatory referral to Cardiac Electrophysiology     Other   Cardiac pacemaker in situ (Chronic)   Pacemaker placed post-cardiac arrest due to bradycardia. Currently functioning well. Transition of follow-up to current clinic planned for consolidated care. - Transition pacemaker follow-up to the current clinic. -  Ensure pacemaker monitoring is set up with the new clinic.      Relevant Orders   ECHOCARDIOGRAM COMPLETE   Ambulatory referral to Cardiac Electrophysiology   S/P TAVR (transcatheter aortic valve replacement) (Chronic)   Normal functioning TAVR valve on multiple different echocardiograms and will be having another echo here in July to follow-up her mitral valve.  - Continue to follow with echoes - Discussed need for SBE prophylaxis      Relevant Orders   ECHOCARDIOGRAM COMPLETE     Follow-Up: Return in about 4 months (around 02/13/2024) for Routine follow up with me, [employer].  Total time spent: 29 min spent with patient + 38 min spent charting = 57 min I spent 57 minutes in the care of Crystal Rose today including reviewing outside labs from [facility] hospitals (2 minutes), reviewing studies ([place] echocardiogram images and report reviewed-5 minutes), reviewing outside studies (2 echocardiograms, CTA heart and PPM procedure reviewed-9 minutes), face to face time discussing treatment options (29), reviewing records from hospitalizations at [facility] x 2 as well as APP follow-up visits x 2 and PCP note (8 minutes), 14 minutes dictating, and documenting in the encounter.    Signed, Arleen Lacer, MD, MS Randene Bustard, M.D., M.S. Interventional Cardiologist  [facility]  Pager # [phone] Phone # [phone] [address] [place], [place] [address]

## 2023-10-14 NOTE — Patient Instructions (Addendum)
 Medication Instructions:   No changes  *If you need a refill on your cardiac medications before your next appointment, please call your pharmacy*   Lab Work: Not needed If you have labs (blood work) drawn today and your tests are completely normal, you will receive your results only by: MyChart Message (if you have MyChart) OR A paper copy in the mail If you have any lab test that is abnormal or we need to change your treatment, we will call you to review the results.   Testing/Procedures: Will be schedule at [address] July 2025 Your physician has requested that you have an echocardiogram. Echocardiography is a painless test that uses sound waves to create images of your heart. It provides your doctor with information about the size and shape of your heart and how well your heart's chambers and valves are working. This procedure takes approximately one hour. There are no restrictions for this procedure. Please do NOT wear cologne, perfume, aftershave, or lotions (deodorant is allowed). Please arrive 15 minutes prior to your appointment time.  Please note: We ask at that you not bring children with you during ultrasound (echo/ vascular) testing. Due to room size and safety concerns, children are not allowed in the ultrasound rooms during exams. Our front office staff cannot provide observation of children in our lobby area while testing is being conducted. An adult accompanying a patient to their appointment will only be allowed in the ultrasound room at the discretion of the ultrasound technician under special circumstances. We apologize for any inconvenience.    Follow-Up: At [facility], you and your health needs are our priority.  As part of our continuing mission to provide you with exceptional heart care, we have created designated Provider Care Teams.  These Care Teams include your primary Cardiologist (physician) and Advanced Practice Providers (APPs -  Physician  Assistants and Nurse Practitioners) who all work together to provide you with the care you need, when you need it.     Your next appointment:   4 month(s)  The format for your next appointment:   In Person  Provider:   Randene Bustard, MD     We will have your device manage your pacemaker with our device clinic  ( new pacemaker  in Dec 2024)  Other Instructions  s

## 2023-10-16 ENCOUNTER — Encounter: Payer: Self-pay | Admitting: Cardiology

## 2023-10-16 DIAGNOSIS — I5032 Chronic diastolic (congestive) heart failure: Secondary | ICD-10-CM | POA: Insufficient documentation

## 2023-10-16 DIAGNOSIS — D6869 Other thrombophilia: Secondary | ICD-10-CM | POA: Insufficient documentation

## 2023-10-16 NOTE — Assessment & Plan Note (Signed)
 Brief episode of A-fib while hospitalized for pneumonia.  With valvular heart disease she certainly has a substrate for A-fib with some left atrial dilation as well.  Thankfully, her underlying rate has been well-controlled and she is not on any rate control agents at baseline.  Will continue to hold off on any rate control agents for now and simply continue with coverage with Eliquis for stroke prophylaxis.  Hopefully with PPM interrogation we can determine if she is having episodes of A-fib that she is unaware of. -Continue Eliquis 2.5 mg twice daily

## 2023-10-16 NOTE — Assessment & Plan Note (Signed)
 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  Careful with BP management given advanced age and concerns for hypotension.  PRN furosemide that she is rarely using.  I suspect that she became symptomatic in the setting of her pneumonia as well as A-fib RVR.  Certainly if she were to have another episode of A-fib RVR she could easily go back into heart failure, and would want to consider AAD therapy.

## 2023-10-16 NOTE — Assessment & Plan Note (Signed)
 Pacemaker placed post-cardiac arrest due to bradycardia. Currently functioning well. Transition of follow-up to current clinic planned for consolidated care. - Transition pacemaker follow-up to the current clinic. - Ensure pacemaker monitoring is set up with the new clinic.

## 2023-10-16 NOTE — Assessment & Plan Note (Signed)
 Pacemaker placed in December 2024.

## 2023-10-16 NOTE — Assessment & Plan Note (Signed)
 Started on Eliquis for A-fib RVR in the setting of pneumonia.  Elevated CHA2DS2-VASc score warrants continued therapy. - Would be okay to hold Eliquis if necessary for procedures or surgeries (2 to 3 days depending on the procedure)

## 2023-10-16 NOTE — Assessment & Plan Note (Signed)
 Normal functioning TAVR valve on multiple different echocardiograms and will be having another echo here in July to follow-up her mitral valve.  - Continue to follow with echoes - Discussed need for SBE prophylaxis

## 2023-10-16 NOTE — Assessment & Plan Note (Signed)
 Moderate to severe stenosis with mean gradient of 11.  Pretty much asymptomatic, no heart failure or dyspnea.  Watchful waiting advised due to surgical complexity and lack of symptoms.   I am concerned however with her episode of A-fib while in the hospital for pneumonia that this could potentially exacerbate her symptoms. - Order follow-up echocardiogram in July. - Consult with Dr. Sherene Dilling (CVTS) regarding echocardiogram findings and potential intervention. - Consider referral to mitral valve surgeon if necessary. - Monitor for symptoms such as dyspnea or heart failure.

## 2023-10-16 NOTE — Assessment & Plan Note (Signed)
 She had only been on very low-dose ramipril but with her current blood pressures and recent hospitalizations we will hold off on restarting for now.  We may need rate control if she has recurrence of A-fib.

## 2023-10-16 NOTE — Assessment & Plan Note (Signed)
 Last labs were September 2024 and LDL was 65 on 10 mg rosuvastatin .  - Continue rosuvastatin  10 mg.  Labs followed in PCP.

## 2023-11-23 ENCOUNTER — Ambulatory Visit
Admission: RE | Admit: 2023-11-23 | Discharge: 2023-11-23 | Disposition: A | Discharge status: Home / Self Care | Payer: Medicare Other | Source: Ambulatory Visit | Attending: Family | Admitting: Family

## 2023-11-23 ENCOUNTER — Other Ambulatory Visit: Payer: Self-pay | Admitting: Family

## 2023-11-23 DIAGNOSIS — Z780 Asymptomatic menopausal state: Secondary | ICD-10-CM

## 2023-11-23 DIAGNOSIS — Z1231 Encounter for screening mammogram for malignant neoplasm of breast: Secondary | ICD-10-CM

## 2023-11-25 ENCOUNTER — Encounter: Payer: Self-pay | Admitting: Cardiology

## 2023-11-25 ENCOUNTER — Ambulatory Visit: Attending: Cardiology | Admitting: Cardiology

## 2023-11-25 VITALS — BP 134/68 | HR 67 | Ht <= 58 in | Wt 108.0 lb

## 2023-11-25 DIAGNOSIS — I480 Paroxysmal atrial fibrillation: Secondary | ICD-10-CM

## 2023-11-25 DIAGNOSIS — I442 Atrioventricular block, complete: Secondary | ICD-10-CM | POA: Diagnosis not present

## 2023-11-25 DIAGNOSIS — I10 Essential (primary) hypertension: Secondary | ICD-10-CM

## 2023-11-25 DIAGNOSIS — D6869 Other thrombophilia: Secondary | ICD-10-CM | POA: Diagnosis not present

## 2023-11-25 NOTE — Patient Instructions (Addendum)
 Medication Instructions:  Your physician recommends that you continue on your current medications as directed. Please refer to the Current Medication list given to you today.  *If you need a refill on your cardiac medications before your next appointment, please call your pharmacy*  Lab Work: None ordered.  You may go to any [facility] Location for your lab work:  [facility] - [id number] ([facility]) - [address] [phone] [address]  [place] - [address]   [place]  - [address]   [place] - [address] - [address] [employer]  - [address] ([facility]   If you have labs (blood work) drawn today and your tests are completely normal, you will receive your results only by: [employer] (if you have MyChart)  If you have any lab test that is abnormal or we need to change your treatment, we will call you or send a MyChart message to review the results.  Testing/Procedures: None ordered.  Follow-Up: At [facility], you and your health needs are our priority.  As part of our continuing mission to provide you with exceptional heart care, we have created designated Provider Care Teams.  These Care Teams include your primary Cardiologist (physician) and Advanced Practice Providers (APPs -  Physician Assistants and Nurse Practitioners) who all work together to provide you with the care you need, when you need it.   Your next appointment:   2 year(s)  The format for your next appointment:   In Person  Provider:     Mertha Abrahams, PA-C Bambi Lever [address] [place], PA-C Neda Balk, NP

## 2023-11-25 NOTE — Progress Notes (Signed)
  Electrophysiology Office Note:   Date:  11/25/2023  ID:  Crystal Rose, DOB 1946-02-25, MRN [id number]  Primary Cardiologist: Randene Bustard, MD Primary Heart Failure: None Electrophysiologist: None      History of Present Illness:   Crystal Rose is a 78 y.o. female with h/o aortic stenosis post TAVR in 2020, complete heart block, hypertension, mitral stenosis seen today for  for Electrophysiology evaluation of pacemaker at the request of Randene Bustard.    She presented to [facility] December 2024.  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  She is post pacemaker implant. She also has a history of aortic stenosis and is post TAVR in 2020.  Prior to her pacemaker implant, she woke up hot and sweaty.  She had heart rates of 20 bpm.  At the same time, she was diagnosed with [facility] spotted fever.  Her pacemaker implant and recovery from her infection, she has done well.  She has had no chest pain or shortness of breath.  She is able to do all of her daily activities without restriction.  Review of systems complete and found to be negative unless listed in HPI.      EP Information / Studies Reviewed:    EKG is not ordered today. EKG from 10/14/2023 reviewed which showed sinus rhythm, left bundle branch block      PPM Interrogation-  reviewed in detail today,  See PACEART report.  Device History: Medtronic Dual Chamber PPM implanted December 2024 for for CHB  Risk Assessment/Calculations:           Physical Exam:   VS:  There were no vitals taken for this visit.   Wt Readings from Last 3 Encounters:  10/14/23 108 lb (49 kg)  07/20/23 112 lb 6.4 oz (51 kg)  06/12/23 119 lb 6.4 oz (54.2 kg)     GEN: Well nourished, well developed in no acute distress NECK: No JVD; No carotid bruits CARDIAC: Regular rate and rhythm, no murmurs, rubs, gallops RESPIRATORY:  Clear to auscultation without rales, wheezing or rhonchi  ABDOMEN: Soft, non-tender,  non-distended EXTREMITIES:  No edema; No deformity   ASSESSMENT AND PLAN:    CHB s/p Medtronic PPM  Normal PPM function See Pace Art report Sensing threshold impedance within normal limits Programming reviewed and appropriate Dmari Schubring arrange for remote monitoring with device clinic. No changes today  2.  Hypertension: Well-controlled  3.  Post AVR: Stable on most recent echo  4.  Moderate mitral stenosis: Overall asymptomatic.  Management per primary cardiology.  5.  Paroxysmal atrial fibrillation: Occurred while hospitalized with pneumonia.  On Eliquis.  Disposition:   Follow up with EP APP 24 months   Signed, Kalvin Buss [place] Ding, MD

## 2023-12-01 ENCOUNTER — Ambulatory Visit
Admission: RE | Admit: 2023-12-01 | Discharge: 2023-12-01 | Disposition: A | Discharge status: Home / Self Care | Source: Ambulatory Visit | Attending: Family | Admitting: Family

## 2023-12-01 ENCOUNTER — Ambulatory Visit

## 2023-12-01 DIAGNOSIS — Z1231 Encounter for screening mammogram for malignant neoplasm of breast: Secondary | ICD-10-CM

## 2023-12-18 ENCOUNTER — Ambulatory Visit: Admitting: Internal Medicine

## 2024-02-01 ENCOUNTER — Ambulatory Visit (HOSPITAL_COMMUNITY)
Admission: RE | Admit: 2024-02-01 | Discharge: 2024-02-01 | Disposition: A | Discharge status: Home / Self Care | Source: Ambulatory Visit | Attending: Cardiology | Admitting: Cardiology

## 2024-02-01 DIAGNOSIS — I050 Rheumatic mitral stenosis: Secondary | ICD-10-CM | POA: Insufficient documentation

## 2024-02-01 DIAGNOSIS — I480 Paroxysmal atrial fibrillation: Secondary | ICD-10-CM | POA: Insufficient documentation

## 2024-02-01 DIAGNOSIS — Z952 Presence of prosthetic heart valve: Secondary | ICD-10-CM | POA: Diagnosis present

## 2024-02-01 DIAGNOSIS — I5032 Chronic diastolic (congestive) heart failure: Secondary | ICD-10-CM | POA: Insufficient documentation

## 2024-02-01 DIAGNOSIS — Z950 Presence of cardiac pacemaker: Secondary | ICD-10-CM | POA: Diagnosis present

## 2024-02-01 DIAGNOSIS — I342 Nonrheumatic mitral (valve) stenosis: Secondary | ICD-10-CM | POA: Diagnosis not present

## 2024-02-01 DIAGNOSIS — E785 Hyperlipidemia, unspecified: Secondary | ICD-10-CM | POA: Diagnosis present

## 2024-02-01 LAB — ECHOCARDIOGRAM COMPLETE
AV Mean grad: 9 mmHg
AV Peak grad: 17.7 mmHg
Ao pk vel: 2.11 m/s
Area-P 1/2: 2.22 cm2
S' Lateral: 2.16 cm

## 2024-02-03 ENCOUNTER — Ambulatory Visit: Admitting: Allergy and Immunology

## 2024-02-07 ENCOUNTER — Other Ambulatory Visit: Payer: Self-pay | Admitting: Cardiology

## 2024-02-07 ENCOUNTER — Ambulatory Visit: Payer: Self-pay | Admitting: Cardiology

## 2024-04-25 ENCOUNTER — Ambulatory Visit

## 2024-04-25 DIAGNOSIS — I480 Paroxysmal atrial fibrillation: Secondary | ICD-10-CM | POA: Diagnosis not present

## 2024-04-26 ENCOUNTER — Ambulatory Visit: Payer: Self-pay | Admitting: Cardiology

## 2024-04-26 LAB — CUP PACEART REMOTE DEVICE CHECK
Battery Remaining Longevity: 159 mo
Battery Voltage: 3.1 V
Brady Statistic AP VP Percent: 40.22 %
Brady Statistic AP VS Percent: 6.31 %
Brady Statistic AS VP Percent: 46.19 %
Brady Statistic AS VS Percent: 7.28 %
Brady Statistic RA Percent Paced: 46.84 %
Brady Statistic RV Percent Paced: 86.41 %
Date Time Interrogation Session: 20251102192240
Implantable Lead Connection Status: 753985
Implantable Lead Connection Status: 753985
Implantable Lead Implant Date: 20241202
Implantable Lead Implant Date: 20241202
Implantable Lead Location: 753859
Implantable Lead Location: 753860
Implantable Lead Model: 5076
Implantable Lead Model: 5076
Implantable Pulse Generator Implant Date: 20241202
Lead Channel Impedance Value: 266 Ohm
Lead Channel Impedance Value: 323 Ohm
Lead Channel Impedance Value: 418 Ohm
Lead Channel Impedance Value: 456 Ohm
Lead Channel Pacing Threshold Amplitude: 0.625 V
Lead Channel Pacing Threshold Amplitude: 0.75 V
Lead Channel Pacing Threshold Pulse Width: 0.4 ms
Lead Channel Pacing Threshold Pulse Width: 0.4 ms
Lead Channel Sensing Intrinsic Amplitude: 10 mV
Lead Channel Sensing Intrinsic Amplitude: 10 mV
Lead Channel Sensing Intrinsic Amplitude: 2 mV
Lead Channel Sensing Intrinsic Amplitude: 2 mV
Lead Channel Setting Pacing Amplitude: 1.5 V
Lead Channel Setting Pacing Amplitude: 2 V
Lead Channel Setting Pacing Pulse Width: 0.4 ms
Lead Channel Setting Sensing Sensitivity: 1.2 mV
Zone Setting Status: 755011
Zone Setting Status: 755011

## 2024-04-27 NOTE — Progress Notes (Signed)
 Remote PPM Transmission

## 2024-05-02 ENCOUNTER — Encounter: Payer: Self-pay | Admitting: Cardiology

## 2024-05-02 ENCOUNTER — Ambulatory Visit: Attending: Cardiology | Admitting: Cardiology

## 2024-05-02 VITALS — BP 144/66 | HR 70 | Ht <= 58 in | Wt 116.6 lb

## 2024-05-02 DIAGNOSIS — Z952 Presence of prosthetic heart valve: Secondary | ICD-10-CM

## 2024-05-02 DIAGNOSIS — I480 Paroxysmal atrial fibrillation: Secondary | ICD-10-CM

## 2024-05-02 DIAGNOSIS — D6869 Other thrombophilia: Secondary | ICD-10-CM

## 2024-05-02 DIAGNOSIS — E785 Hyperlipidemia, unspecified: Secondary | ICD-10-CM

## 2024-05-02 DIAGNOSIS — I10 Essential (primary) hypertension: Secondary | ICD-10-CM | POA: Diagnosis not present

## 2024-05-02 DIAGNOSIS — I350 Nonrheumatic aortic (valve) stenosis: Secondary | ICD-10-CM | POA: Diagnosis not present

## 2024-05-02 DIAGNOSIS — I050 Rheumatic mitral stenosis: Secondary | ICD-10-CM

## 2024-05-02 DIAGNOSIS — I442 Atrioventricular block, complete: Secondary | ICD-10-CM | POA: Diagnosis not present

## 2024-05-02 DIAGNOSIS — Z950 Presence of cardiac pacemaker: Secondary | ICD-10-CM

## 2024-05-02 NOTE — Patient Instructions (Signed)
 Medication Instructions:   No changes  *If you need a refill on your cardiac medications before your next appointment, please call your pharmacy*   Lab Work: Not needed   Testing/Procedures: Oct or Nov 2026 Your physician has requested that you have an echocardiogram. Echocardiography is a painless test that uses sound waves to create images of your heart. It provides your doctor with information about the size and shape of your heart and how well your heart's chambers and valves are working. This procedure takes approximately one hour. There are no restrictions for this procedure. Please do NOT wear cologne, perfume, aftershave, or lotions (deodorant is allowed). Please arrive 15 minutes prior to your appointment time.  Please note: We ask at that you not bring children with you during ultrasound (echo/ vascular) testing. Due to room size and safety concerns, children are not allowed in the ultrasound rooms during exams. Our front office staff cannot provide observation of children in our lobby area while testing is being conducted. An adult accompanying a patient to their appointment will only be allowed in the ultrasound room at the discretion of the ultrasound technician under special circumstances. We apologize for any inconvenience.    Follow-Up: At [facility], you and your health needs are our priority.  As part of our continuing mission to provide you with exceptional heart care, we have created designated Provider Care Teams.  These Care Teams include your primary Cardiologist (physician) and Advanced Practice Providers (APPs -  Physician Assistants and Nurse Practitioners) who all work together to provide you with the care you need, when you need it.     Your next appointment:   12 month(s)  The format for your next appointment:   In Person  Provider:   Alm Clay, MD   Other Instructions

## 2024-05-02 NOTE — Progress Notes (Unsigned)
 Cardiology Office Note:  .   Date:  05/05/2024  ID:  Crystal Rose, DOB July 02, 1945, MRN [id number] PCP: Rose Coolidge SQUIBB, NP  [facility] Providers Cardiologist:  Alm Clay, MD Electrophysiologist:  Will Gladis Norton, MD     Chief Complaint  Patient presents with   Follow-up   Cardiac Valve Problem    Status post TAVR with underlying mitral valve disease as well.  No symptoms from mitral valve disease.   Atrial Fibrillation    As far she can tell no breakthrough episodes    Patient Profile: .     Crystal Rose is a healthy-appearing 78 y.o. female  with a PMH notable for Valvular Heart Disease-severe AS s/p TAVR and moderate to severe MS as well as Symptomatic Bradycardia/CHB-S/p PPM who presents here for 42-month follow-up at the request of Rose Coolidge SQUIBB, NP.  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 Von) PAF-in the setting of pneumonia.  Now on DOAC.      I last saw Crystal Rose October 14, 2023 as a 88-month follow-up from visit with Josefa Beauvais in late January 2025.  She was admitted to [facility] in early December 2024 (December 2 to 3) with Complete Heart Block and diagnosed with RMSF.  She underwent PPM placement and was treated with IV doxycycline.  She subsequently was readmitted to [facility] and transferred to [facility] on June 21, 2023 with leukoplakia significant pneumonia with pleural effusion treated with thoracentesis.  She is found essentially volume overloaded, and actually was found to be in A-fib.  When I saw her she was doing a whole lot better.  Energy level improved.  She is walking around without any chest pain or dyspnea.  No syncope or near syncope.  No prolonged fluttering to suggest recurrence of A-fib.  She was due for  follow-up echocardiogram for her valvular disease.  She had pretty much recovered from her hospitalization.  We discussed SBE prophylaxis.  I also referred her to EP to discuss management of A-fib.  She saw Dr. Norton on November 25 2023.  She had recovered well from her pneumonia infection and was doing well.  PPM working well.  No chest pain or dyspnea.  PPM functioning well.  Thresholds within normal limits.  Appropriate for PPM function.  Plan was for a 65-month follow-up..  Subjective  Discussed the use of AI scribe software for clinical note transcription with the patient, who gave verbal consent to proceed.  History of Present Illness Crystal Rose is an 78 year old female with TAVR, moderate mitral stenosis, hypertension, and PAF who presents for follow-up of her cardiac conditions.  She has no major issues and denies any cardiac-CHF or angina symptoms.  She has had a history of PAF but does not have any rapid irregular beats or palpitations.  No chest pain pressure dyspnea with rest or exertion.  Not as fast as she used to be.  No PND, orthopnea or edema.    She is currently on ramipril 1.25 mg daily, which was recently restarted by her primary care physician.  Her lipid levels are well controlled, and she continues on rosuvastatin  10 mg three times a week.  She remains on Eliquis 2.5 mg daily for her atrial fibrillation. She is not on a rate control medication as her recent  monitoring showed atrial sensing and V-pacing.  Her recent echocardiogram showed a change in her mitral valve stenosis from moderate to severe to mild to moderate.   Cardiovascular ROS: no chest pain or dyspnea on exertion negative for - edema, irregular heartbeat, orthopnea, palpitations, paroxysmal nocturnal dyspnea, rapid heart rate, shortness of breath, or syncope or near syncope, TIA or emesis fugax, claudication  ROS:  Review of Systems - Negative except symptoms noted above.  A little slower than  usual but otherwise doing well    Objective    Studies Reviewed: .        Results DIAGNOSTIC Echocardiogram: Moderate stenosis now mild to moderate as opposed to moderate to severe = improvement of mitral valve gradients.  Otherwise aortic TAVR valve with mean gradient 6 mmHg...  EF 55 to 60%, (02/01/2024)  Echo 06/2023 ([facility]): EF 60 to 65%.  No RWMA.  Severe asymmetric LVH of septal wall.  Moderate LA dilation.  Generative Mitral Valve with moderate MR and moderate to severe MS (mean MVD 11 mL of mercury) moderate to severe TR.  26 mm CoreValve-Evolut Pro prosthetic (TAVR) valve present in the aortic position => no AI or AS, mean AVG 13 mmHg.  Normal RAP.   ECHO ([facility]-06/23/2023): Normal EF greater than 55%.  Moderate MR.  26 mm Medtronic valve stable.  Mild to moderate TR.  Small to moderate size pericardial effusion.  Evidence of elevated RAP, but no evidence of tamponade physiology Echo [facility] 05/25/2023: EF> 55%. 26 mm CoreValve TAVR aortic valve present. Normal function.  Moderate-severe mitral stenosis with mild MR (mean diastolic gradient 10 mmHg at 86 bpm).  Mild PR.   Medtronic PPM 05/26/2023 - PPM @ [facility] => Medtronic Model: Azure XT DR MRI [id number] (Dr. Von) CTA Cardiac 06/23/2023: Tip of the atrial lead terminates 203 mm beyond the internal margin of the right atrium and may represent microperforation.  RV lead terminates in the RV.  Small pericardial effusion with moderate bilateral pleural effusions and pulm edema noted.   Risk Assessment/Calculations:   This patients CHA2DS2-VASc Score and unadjusted Ischemic Stroke Rate (% per year) is equal to 9.7 % stroke rate/year from a score of 6  Above score calculated as 1 point each if present [CHF, HTN, DM, Vascular=MI/PAD/Aortic Plaque, Age if 65-74, or Female] Above score calculated as 2 points each if present [Age > 75, or Stroke/TIA/TE]    HYPERTENSION CONTROL Vitals:   05/02/24 1604 05/02/24 1625  BP: (!) 152/67 (!) 144/66     The patient's blood pressure is elevated above target today.  In order to address the patient's elevated BP: A current anti-hypertensive medication was adjusted today. (Reinstitute ramipril 1.25 mg daily)           Physical Exam:   VS:  BP (!) 144/66   Pulse 70   Ht 4' 10 (1.473 m)   Wt 116 lb 9.6 oz (52.9 kg)   SpO2 98%   BMI 24.37 kg/m    Wt Readings from Last 3 Encounters:  05/02/24 116 lb 9.6 oz (52.9 kg)  11/25/23 108 lb (49 kg)  10/14/23 108 lb (49 kg)      GEN: Well nourished, well groomed; in no acute distress; healthy-appearing. NECK: No JVD; No carotid bruits CARDIAC: Normal S1, S2; RRR, no murmurs, rubs, gallops RESPIRATORY:  Clear to auscultation without rales, wheezing or rhonchi ; nonlabored, good air movement. ABDOMEN: Soft, non-tender, non-distended EXTREMITIES:  No edema; No deformity      ASSESSMENT AND  PLAN: .    Problem List Items Addressed This Visit       Cardiology Problems   Heart block AV complete (HCC) (Chronic)   Status post PPM.  Doing well.  No major issues.      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 This was all thrown off in the setting of her pneumonia.  Hopefully this will stabilize out.      Relevant Medications   ramipril (ALTACE) 1.25 MG capsule   Hyperlipidemia LDL goal <100 (Chronic)   Lipid levels well controlled with current medication regimen. - Continue rosuvastatin  10 mg three times a week.      Relevant Medications   ramipril (ALTACE) 1.25 MG capsule   Hypertension (Chronic)   Blood pressure elevated. Consideration for dose increase due to current readings. - Okay to restart ramipril 1.25 mg daily. - Consider increasing ramipril dose if blood pressure remains elevated.      Relevant Medications   ramipril (ALTACE) 1.25 MG capsule    Moderate mitral stenosis by prior echocardiography (Chronic)   Previous echo suggested moderate to severe mitral stenosis but the most recent echo showed only mild to moderate.  Barely able to hear this on exam. No symptoms. Continue to monitor.        Relevant Medications   ramipril (ALTACE) 1.25 MG capsule   PAF (paroxysmal atrial fibrillation) (HCC) (Chronic)   Currently on Eliquis 2.5 mg daily for anticoagulation, but no rate control needed.  She had complete heart block and is now mostly a sensed V paced. . V-pacing dependent atrial fibrillation noted on recent monitor. - Continue Eliquis 2.5 mg daily.      Relevant Medications   ramipril (ALTACE) 1.25 MG capsule   Severe aortic stenosis (Chronic)   Status post TAVR with excellent of TAVR valve gradients on echocardiogram.  Due for echo in 1 year-August-September.  Discussed SBE prophylaxis      Relevant Medications   ramipril (ALTACE) 1.25 MG capsule   Other Relevant Orders   ECHOCARDIOGRAM COMPLETE     Other   Cardiac pacemaker in situ (Chronic)   Followed by EP.  Seems to be functioning well.      S/P TAVR (transcatheter aortic valve replacement) - Primary (Chronic)   Valve working well.  Continue to monitor. SBE prophylaxis discussed      Relevant Orders   ECHOCARDIOGRAM COMPLETE          Follow-Up: Return in about 1 year (around 05/02/2025) for 1 Yr Follow-up, To discuss test results, [employer] [facility] [place].     Signed, Alm MICAEL Clay, MD, MS Alm Clay, M.D., M.S. Interventional Cardiologist  [facility] Pager # [phone]

## 2024-05-05 ENCOUNTER — Encounter: Payer: Self-pay | Admitting: Cardiology

## 2024-05-05 NOTE — Assessment & Plan Note (Signed)
 She seems to be tolerating apixaban 2.5 mg without too much of a bleeding issue. This was all thrown off in the setting of her pneumonia.  Hopefully this will stabilize out.

## 2024-05-05 NOTE — Assessment & Plan Note (Signed)
 Blood pressure elevated. Consideration for dose increase due to current readings. - Okay to restart ramipril 1.25 mg daily. - Consider increasing ramipril dose if blood pressure remains elevated.

## 2024-05-05 NOTE — Assessment & Plan Note (Signed)
 Currently on Eliquis 2.5 mg daily for anticoagulation, but no rate control needed.  She had complete heart block and is now mostly a sensed V paced. . V-pacing dependent atrial fibrillation noted on recent monitor. - Continue Eliquis 2.5 mg daily.

## 2024-05-05 NOTE — Assessment & Plan Note (Signed)
 Lipid levels well controlled with current medication regimen. - Continue rosuvastatin  10 mg three times a week.

## 2024-05-05 NOTE — Assessment & Plan Note (Signed)
 Status post PPM.  Doing well.  No major issues.

## 2024-05-05 NOTE — Assessment & Plan Note (Signed)
 Previous echo suggested moderate to severe mitral stenosis but the most recent echo showed only mild to moderate.  Barely able to hear this on exam. No symptoms. Continue to monitor.

## 2024-05-05 NOTE — Assessment & Plan Note (Signed)
 Followed by EP.  Seems to be functioning well.

## 2024-05-05 NOTE — Assessment & Plan Note (Signed)
 Valve working well.  Continue to monitor. SBE prophylaxis discussed

## 2024-05-05 NOTE — Assessment & Plan Note (Addendum)
 Status post TAVR with excellent of TAVR valve gradients on echocardiogram.  Due for echo in 1 year-August-September.  Discussed SBE prophylaxis

## 2024-07-22 ENCOUNTER — Other Ambulatory Visit: Payer: Self-pay | Admitting: Cardiology

## 2024-07-25 ENCOUNTER — Ambulatory Visit

## 2024-07-25 DIAGNOSIS — I480 Paroxysmal atrial fibrillation: Secondary | ICD-10-CM

## 2024-07-26 LAB — CUP PACEART REMOTE DEVICE CHECK
Battery Remaining Longevity: 150 mo
Battery Voltage: 3.05 V
Brady Statistic AP VP Percent: 41.82 %
Brady Statistic AP VS Percent: 0.31 %
Brady Statistic AS VP Percent: 57.55 %
Brady Statistic AS VS Percent: 0.32 %
Brady Statistic RA Percent Paced: 42.1 %
Brady Statistic RV Percent Paced: 99.37 %
Date Time Interrogation Session: 20260202092229
Implantable Lead Connection Status: 753985
Implantable Lead Connection Status: 753985
Implantable Lead Implant Date: 20241202
Implantable Lead Implant Date: 20241202
Implantable Lead Location: 753859
Implantable Lead Location: 753860
Implantable Lead Model: 5076
Implantable Lead Model: 5076
Implantable Pulse Generator Implant Date: 20241202
Lead Channel Impedance Value: 266 Ohm
Lead Channel Impedance Value: 304 Ohm
Lead Channel Impedance Value: 399 Ohm
Lead Channel Impedance Value: 437 Ohm
Lead Channel Pacing Threshold Amplitude: 0.625 V
Lead Channel Pacing Threshold Amplitude: 0.75 V
Lead Channel Pacing Threshold Pulse Width: 0.4 ms
Lead Channel Pacing Threshold Pulse Width: 0.4 ms
Lead Channel Sensing Intrinsic Amplitude: 1.875 mV
Lead Channel Sensing Intrinsic Amplitude: 1.875 mV
Lead Channel Sensing Intrinsic Amplitude: 13.75 mV
Lead Channel Sensing Intrinsic Amplitude: 13.75 mV
Lead Channel Setting Pacing Amplitude: 1.5 V
Lead Channel Setting Pacing Amplitude: 2 V
Lead Channel Setting Pacing Pulse Width: 0.4 ms
Lead Channel Setting Sensing Sensitivity: 1.2 mV
Zone Setting Status: 755011
Zone Setting Status: 755011

## 2024-07-29 NOTE — Progress Notes (Signed)
 Remote PPM Transmission

## 2024-10-24 ENCOUNTER — Encounter

## 2025-01-23 ENCOUNTER — Encounter
